# Patient Record
Sex: Female | Born: 1995 | Race: Black or African American | Hispanic: No | Marital: Single | Smoking: Never smoker
Health system: Southern US, Community
[De-identification: ages and names within clinical notes are randomized; demographics above are authoritative.]

## PROBLEM LIST (undated history)

## (undated) DIAGNOSIS — N912 Amenorrhea, unspecified: Secondary | ICD-10-CM

## (undated) DIAGNOSIS — R002 Palpitations: Secondary | ICD-10-CM

## (undated) DIAGNOSIS — E669 Obesity, unspecified: Secondary | ICD-10-CM

## (undated) DIAGNOSIS — E282 Polycystic ovarian syndrome: Secondary | ICD-10-CM

## (undated) DIAGNOSIS — M7989 Other specified soft tissue disorders: Secondary | ICD-10-CM

## (undated) DIAGNOSIS — E559 Vitamin D deficiency, unspecified: Secondary | ICD-10-CM

## (undated) DIAGNOSIS — K5900 Constipation, unspecified: Secondary | ICD-10-CM

## (undated) DIAGNOSIS — M2550 Pain in unspecified joint: Secondary | ICD-10-CM

## (undated) DIAGNOSIS — R0602 Shortness of breath: Secondary | ICD-10-CM

## (undated) DIAGNOSIS — M549 Dorsalgia, unspecified: Secondary | ICD-10-CM

## (undated) HISTORY — DX: Dorsalgia, unspecified: M54.9

## (undated) HISTORY — DX: Polycystic ovarian syndrome: E28.2

## (undated) HISTORY — DX: Vitamin D deficiency, unspecified: E55.9

## (undated) HISTORY — DX: Palpitations: R00.2

## (undated) HISTORY — DX: Other specified soft tissue disorders: M79.89

## (undated) HISTORY — DX: Amenorrhea, unspecified: N91.2

## (undated) HISTORY — DX: Pain in unspecified joint: M25.50

## (undated) HISTORY — DX: Shortness of breath: R06.02

## (undated) HISTORY — DX: Constipation, unspecified: K59.00

## (undated) HISTORY — DX: Obesity, unspecified: E66.9

---
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
## 2018-10-27 DIAGNOSIS — J069 Acute upper respiratory infection, unspecified: Secondary | ICD-10-CM | POA: Diagnosis not present

## 2019-05-20 DIAGNOSIS — N390 Urinary tract infection, site not specified: Secondary | ICD-10-CM | POA: Diagnosis not present

## 2019-10-21 ENCOUNTER — Other Ambulatory Visit (HOSPITAL_COMMUNITY)
Admission: RE | Admit: 2019-10-21 | Discharge: 2019-10-21 | Disposition: A | Discharge status: Home / Self Care | Payer: BC Managed Care – PPO | Source: Ambulatory Visit | Attending: Physician Assistant | Admitting: Physician Assistant

## 2019-10-21 ENCOUNTER — Other Ambulatory Visit: Payer: Self-pay | Admitting: Physician Assistant

## 2019-10-21 DIAGNOSIS — E669 Obesity, unspecified: Secondary | ICD-10-CM | POA: Diagnosis not present

## 2019-10-21 DIAGNOSIS — Z124 Encounter for screening for malignant neoplasm of cervix: Secondary | ICD-10-CM | POA: Insufficient documentation

## 2019-10-21 DIAGNOSIS — N912 Amenorrhea, unspecified: Secondary | ICD-10-CM | POA: Diagnosis not present

## 2019-10-21 DIAGNOSIS — Z23 Encounter for immunization: Secondary | ICD-10-CM | POA: Diagnosis not present

## 2019-10-21 DIAGNOSIS — R635 Abnormal weight gain: Secondary | ICD-10-CM | POA: Diagnosis not present

## 2019-10-21 DIAGNOSIS — Z0000 Encounter for general adult medical examination without abnormal findings: Secondary | ICD-10-CM | POA: Diagnosis not present

## 2019-10-26 LAB — CYTOLOGY - PAP
Comment: NEGATIVE
High risk HPV: POSITIVE — AB

## 2020-04-02 ENCOUNTER — Ambulatory Visit (INDEPENDENT_AMBULATORY_CARE_PROVIDER_SITE_OTHER): Payer: BC Managed Care – PPO | Admitting: Family Medicine

## 2020-04-02 ENCOUNTER — Encounter (INDEPENDENT_AMBULATORY_CARE_PROVIDER_SITE_OTHER): Payer: Self-pay | Admitting: Family Medicine

## 2020-04-02 ENCOUNTER — Other Ambulatory Visit: Payer: Self-pay

## 2020-04-02 VITALS — BP 108/75 | HR 72 | Temp 98.5°F | Ht 68.0 in | Wt 250.0 lb

## 2020-04-02 DIAGNOSIS — Z9189 Other specified personal risk factors, not elsewhere classified: Secondary | ICD-10-CM | POA: Diagnosis not present

## 2020-04-02 DIAGNOSIS — R0602 Shortness of breath: Secondary | ICD-10-CM

## 2020-04-02 DIAGNOSIS — F3289 Other specified depressive episodes: Secondary | ICD-10-CM | POA: Diagnosis not present

## 2020-04-02 DIAGNOSIS — R5383 Other fatigue: Secondary | ICD-10-CM

## 2020-04-02 DIAGNOSIS — Z6838 Body mass index (BMI) 38.0-38.9, adult: Secondary | ICD-10-CM

## 2020-04-02 DIAGNOSIS — N912 Amenorrhea, unspecified: Secondary | ICD-10-CM

## 2020-04-02 DIAGNOSIS — Z0289 Encounter for other administrative examinations: Secondary | ICD-10-CM

## 2020-04-03 LAB — LIPID PANEL WITH LDL/HDL RATIO
Cholesterol, Total: 154 mg/dL (ref 100–199)
HDL: 52 mg/dL (ref >39)
LDL Chol Calc (NIH): 89 mg/dL (ref 0–99)
LDL/HDL Ratio: 1.7 ratio (ref 0.0–3.2)
Triglycerides: 67 mg/dL (ref 0–149)
VLDL Cholesterol Cal: 13 mg/dL (ref 5–40)

## 2020-04-03 LAB — CBC WITH DIFFERENTIAL/PLATELET
Basophils Absolute: 0.1 10*3/uL (ref 0.0–0.2)
Basos: 1 %
EOS (ABSOLUTE): 0.1 10*3/uL (ref 0.0–0.4)
Eos: 2 %
Hematocrit: 41.4 % (ref 34.0–46.6)
Hemoglobin: 13.5 g/dL (ref 11.1–15.9)
Immature Grans (Abs): 0.1 10*3/uL (ref 0.0–0.1)
Immature Granulocytes: 1 %
Lymphocytes Absolute: 1.7 10*3/uL (ref 0.7–3.1)
Lymphs: 27 %
MCH: 24.8 pg — ABNORMAL LOW (ref 26.6–33.0)
MCHC: 32.6 g/dL (ref 31.5–35.7)
MCV: 76 fL — ABNORMAL LOW (ref 79–97)
Monocytes Absolute: 0.6 10*3/uL (ref 0.1–0.9)
Monocytes: 9 %
Neutrophils Absolute: 3.8 10*3/uL (ref 1.4–7.0)
Neutrophils: 60 %
Platelets: 321 10*3/uL (ref 150–450)
RBC: 5.44 x10E6/uL — ABNORMAL HIGH (ref 3.77–5.28)
RDW: 14.9 % (ref 11.7–15.4)
WBC: 6.4 10*3/uL (ref 3.4–10.8)

## 2020-04-03 LAB — TSH: TSH: 0.973 u[IU]/mL (ref 0.450–4.500)

## 2020-04-03 LAB — FOLATE: Folate: 14.6 ng/mL (ref >3.0)

## 2020-04-03 LAB — COMPREHENSIVE METABOLIC PANEL
ALT: 15 IU/L (ref 0–32)
AST: 18 IU/L (ref 0–40)
Albumin/Globulin Ratio: 1.3 (ref 1.2–2.2)
Albumin: 4.5 g/dL (ref 3.9–5.0)
Alkaline Phosphatase: 106 IU/L (ref 48–121)
BUN/Creatinine Ratio: 14 (ref 9–23)
BUN: 11 mg/dL (ref 6–20)
Bilirubin Total: 0.6 mg/dL (ref 0.0–1.2)
CO2: 25 mmol/L (ref 20–29)
Calcium: 9.7 mg/dL (ref 8.7–10.2)
Chloride: 102 mmol/L (ref 96–106)
Creatinine, Ser: 0.81 mg/dL (ref 0.57–1.00)
GFR calc Af Amer: 117 mL/min/{1.73_m2} (ref >59)
GFR calc non Af Amer: 101 mL/min/{1.73_m2} (ref >59)
Globulin, Total: 3.4 g/dL (ref 1.5–4.5)
Glucose: 82 mg/dL (ref 65–99)
Potassium: 4.5 mmol/L (ref 3.5–5.2)
Sodium: 142 mmol/L (ref 134–144)
Total Protein: 7.9 g/dL (ref 6.0–8.5)

## 2020-04-03 LAB — HEMOGLOBIN A1C
Est. average glucose Bld gHb Est-mCnc: 111 mg/dL
Hgb A1c MFr Bld: 5.5 % (ref 4.8–5.6)

## 2020-04-03 LAB — T3: T3, Total: 121 ng/dL (ref 71–180)

## 2020-04-03 LAB — T4: T4, Total: 6.8 ug/dL (ref 4.5–12.0)

## 2020-04-03 LAB — VITAMIN D 25 HYDROXY (VIT D DEFICIENCY, FRACTURES): Vit D, 25-Hydroxy: 28.3 ng/mL — ABNORMAL LOW (ref 30.0–100.0)

## 2020-04-03 LAB — INSULIN, RANDOM: INSULIN: 16 u[IU]/mL (ref 2.6–24.9)

## 2020-04-03 LAB — VITAMIN B12: Vitamin B-12: 729 pg/mL (ref 232–1245)

## 2020-04-04 NOTE — Progress Notes (Signed)
Dear Monica Oiler Redmon, PA-C,   Thank you for referring Monica York to our clinic. The following note includes my evaluation and treatment recommendations.  Chief Complaint:   OBESITY Monica York (MR# [id number]) is a 24 y.o. female who presents for evaluation and treatment of obesity and related comorbidities. Current BMI is Body mass index is 38.01 kg/m. Monica York has been struggling with her weight for many years and has been unsuccessful in either losing weight, maintaining weight loss, or reaching her healthy weight goal.  Monica York is on Provera. 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 She is often skipping breakfast and lunch. She eats for the first time around 4-5 pm, 2 pieces of chicken or takeout, Cookout, or [employer] 4 for 4 (eats and drinks all). Chicken wings, bread, and salad from [employer].  Monica York is currently in the action stage of change and ready to dedicate time achieving and maintaining a healthier weight. Monica York is interested in becoming our patient and working on intensive lifestyle modifications including (but not limited to) diet and exercise for weight loss.  Monica York's habits were reviewed today and are as follows: her desired weight loss is 70 lbs, she has been heavy most of her life, she started gaining weight at 3-97 years old, her heaviest weight ever was 258 pounds, she has significant food cravings issues, she skips meals frequently, she is frequently drinking liquids with calories, she frequently makes poor food choices, she frequently eats larger portions than normal and she struggles with emotional eating.  Depression Screen Monica York's Food and Mood (modified PHQ-9) score was 10.  Depression screen PHQ 2/9 04/02/2020  Decreased Interest 2  Down, Depressed, Hopeless 2  PHQ - 2 Score 4  Altered sleeping 1  Tired, decreased energy 1  Change in appetite 2  Feeling bad or failure about yourself  2  Trouble concentrating 0  Moving slowly or fidgety/restless 0  Suicidal thoughts 0  PHQ-9  Score 10  Difficult doing work/chores Not difficult at all   Subjective:   1. Other fatigue Monica York admits to daytime somnolence and admits to waking up still tired. Patent has a history of symptoms of daytime fatigue and morning headache. Monica York generally gets 6 or 7 hours of sleep per night, and states that she has generally restful sleep. Snoring is present. Apneic episodes are not present. Epworth Sleepiness Score is 14. EKG-T wave abnormality II, III, V3-V6, normal sinus rhythm at 76 BPM. She does report occasional palpitations.  2. SOB (shortness of breath) on exertion Monica York notes increasing shortness of breath with exercising and seems to be worsening over time with weight gain. She notes getting out of breath sooner with activity than she used to. This has not gotten worse recently. Monica York denies shortness of breath at rest or orthopnea.  3. Amenorrhea Monica York has a history of polycystic ovarian syndrome. Last A1c was 5.4.  4. Other depression, with emotional eating Monica York notes occasional emotionally eating, but she can control her symptoms and actions.  5. At risk for hyperglycemia Monica York is at increased risk for hypoglycemia due to changes in diet, diagnosis of diabetes, and/or insulin use.   Assessment/Plan:   1. Other fatigue Monica York does feel that her weight is causing her energy to be lower than it should be. Fatigue may be related to obesity, depression or many other causes. Labs will be ordered, and in the meanwhile, Monica York will focus on self care including making healthy food choices, increasing physical activity and focusing on stress reduction.  -  EKG 12-Lead - Comprehensive metabolic panel - CBC with Differential/Platelet - Lipid Panel With LDL/HDL Ratio - VITAMIN D 25 Hydroxy (Vit-D Deficiency, Fractures) - Vitamin B12 - Folate - T3 - T4 - TSH  2. SOB (shortness of breath) on exertion Monica York does feel that she gets out of breath more easily that she used to when she  exercises. Monica York's shortness of breath appears to be obesity related and exercise induced. She has agreed to work on weight loss and gradually increase exercise to treat her exercise induced shortness of breath. Will continue to monitor closely.  - Comprehensive metabolic panel - CBC with Differential/Platelet - Lipid Panel With LDL/HDL Ratio - VITAMIN D 25 Hydroxy (Vit-D Deficiency, Fractures) - Vitamin B12 - Folate - T3 - T4 - TSH - ECHOCARDIOGRAM COMPLETE; Future  3. Amenorrhea We will check labs today, and Monica York will follow up as directed.  - Hemoglobin A1c - Insulin, random  4. Other depression, with emotional eating Behavior modification techniques were discussed today to help Monica York deal with her emotional/non-hunger eating behaviors. We will follow up at her next appointment. Orders and follow up as documented in patient record.   5. At risk for hyperglycemia Monica York was given approximately 15 minutes of counseling today regarding prevention of hyperglycemia. She was advised of hyperglycemia causes and the fact hyperglycemia is often asymptomatic. Monica York was instructed to avoid skipping meals, eat regular protein rich meals and schedule low calorie but protein rich snacks as needed.   Repetitive spaced learning was employed today to elicit superior memory formation and behavioral change  6. Class 2 severe obesity with serious comorbidity and body mass index (BMI) of 38.0 to 38.9 in adult, unspecified obesity type (HCC) Monica York is currently in the action stage of change and her goal is to continue with weight loss efforts. I recommend Monica York begin the structured treatment plan as follows:  She has agreed to the [employer] + 300 calories.  Exercise goals: No exercise has been prescribed at this time.   Behavioral modification strategies: increasing lean protein intake, meal planning and cooking strategies, keeping healthy foods in the home and planning for success.  She was  informed of the importance of frequent follow-up visits to maximize her success with intensive lifestyle modifications for her multiple health conditions. She was informed we would discuss her lab results at her next visit unless there is a critical issue that needs to be addressed sooner. Monica York agreed to keep her next visit at the agreed upon time to discuss these results.  Objective:   Blood pressure 108/75, pulse 72, temperature 98.5 F (36.9 C), temperature source Oral, height 5\' 8"  (1.727 m), weight 250 lb (113.4 kg), last menstrual period 03/27/2020, SpO2 99 %. Body mass index is 38.01 kg/m.  EKG: Normal sinus rhythm, rate 76 BPM.  Indirect Calorimeter completed today shows a VO2 of 292 and a REE of 2033.  Her calculated basal metabolic rate is 2034 thus her basal metabolic rate is worse than expected.  General: Cooperative, alert, well developed, in no acute distress. HEENT: Conjunctivae and lids unremarkable. Cardiovascular: Regular rhythm.  Lungs: Normal work of breathing. Neurologic: No focal deficits.   Lab Results  Component Value Date   CREATININE 0.81 04/02/2020   BUN 11 04/02/2020   NA 142 04/02/2020   K 4.5 04/02/2020   CL 102 04/02/2020   CO2 25 04/02/2020   Lab Results  Component Value Date   ALT 15 04/02/2020   AST 18 04/02/2020  ALKPHOS 106 04/02/2020   BILITOT 0.6 04/02/2020   Lab Results  Component Value Date   HGBA1C 5.5 04/02/2020   Lab Results  Component Value Date   INSULIN 16.0 04/02/2020   Lab Results  Component Value Date   TSH 0.973 04/02/2020   Lab Results  Component Value Date   CHOL 154 04/02/2020   HDL 52 04/02/2020   LDLCALC 89 04/02/2020   TRIG 67 04/02/2020   Lab Results  Component Value Date   WBC 6.4 04/02/2020   HGB 13.5 04/02/2020   HCT 41.4 04/02/2020   MCV 76 (L) 04/02/2020   PLT 321 04/02/2020   No results found for: IRON, TIBC, FERRITIN  Attestation Statements:   This is the patient's first visit at [facility]. The patient's NEW PATIENT PACKET was reviewed at length. Included in the packet: current and past health history, medications, allergies, ROS, gynecologic history (women only), surgical history, family history, social history, weight history, weight loss surgery history (for those that have had weight loss surgery), nutritional evaluation, mood and food questionnaire, PHQ9, Epworth questionnaire, sleep habits questionnaire, patient life and health improvement goals questionnaire. These will all be scanned into the patient's chart under media.   During the visit, I independently reviewed the patient's EKG, bioimpedance scale results, and indirect calorimeter results. I used this information to tailor a meal plan for the patient that will help her to lose weight and will improve her obesity-related conditions going forward. I performed a medically necessary appropriate examination and/or evaluation. I discussed the assessment and treatment plan with the patient. The patient was provided an opportunity to ask questions and all were answered. The patient agreed with the plan and demonstrated an understanding of the instructions. Labs were ordered at this visit and will be reviewed at the next visit unless more critical results need to be addressed immediately. Clinical information was updated and documented in the EMR.   Time spent on visit including pre-visit chart review and post-visit care was 45 minutes.   A separate 15 minutes was spent on risk counseling (see above).    I, Burt Knack, am acting as transcriptionist for Reuben Likes, MD. I have reviewed the above documentation for accuracy and completeness, and I agree with the above. - Katherina Mires, MD

## 2020-04-09 ENCOUNTER — Ambulatory Visit (INDEPENDENT_AMBULATORY_CARE_PROVIDER_SITE_OTHER): Payer: Managed Care, Other (non HMO) | Admitting: Family Medicine

## 2020-04-16 ENCOUNTER — Encounter (INDEPENDENT_AMBULATORY_CARE_PROVIDER_SITE_OTHER): Payer: Self-pay | Admitting: Family Medicine

## 2020-04-16 ENCOUNTER — Ambulatory Visit (INDEPENDENT_AMBULATORY_CARE_PROVIDER_SITE_OTHER): Payer: BC Managed Care – PPO | Admitting: Family Medicine

## 2020-04-16 ENCOUNTER — Other Ambulatory Visit: Payer: Self-pay

## 2020-04-16 VITALS — BP 107/70 | HR 62 | Temp 98.1°F | Ht 68.0 in | Wt 244.0 lb

## 2020-04-16 DIAGNOSIS — Z9189 Other specified personal risk factors, not elsewhere classified: Secondary | ICD-10-CM

## 2020-04-16 DIAGNOSIS — D509 Iron deficiency anemia, unspecified: Secondary | ICD-10-CM | POA: Diagnosis not present

## 2020-04-16 DIAGNOSIS — E559 Vitamin D deficiency, unspecified: Secondary | ICD-10-CM | POA: Diagnosis not present

## 2020-04-16 DIAGNOSIS — E8881 Metabolic syndrome: Secondary | ICD-10-CM

## 2020-04-16 DIAGNOSIS — Z6838 Body mass index (BMI) 38.0-38.9, adult: Secondary | ICD-10-CM

## 2020-04-16 MED ORDER — VITAMIN D (ERGOCALCIFEROL) 1.25 MG (50000 UNIT) PO CAPS: 50000.0000 [IU] | ORAL_CAPSULE | ORAL | 0 refills | Status: DC

## 2020-04-17 NOTE — Progress Notes (Signed)
Chief Complaint:   OBESITY Monica York is here to discuss her progress with her obesity treatment plan along with follow-up of her obesity related diagnoses. Monica York is on the Pescatarian Plan + 300 calories and states she is following her eating plan approximately 98% of the time. Monica York states she is doing 0 minutes 0 times per week.  Today's visit was #: 2 Starting weight: 250 lbs Starting date: 04/02/2020 Today's weight: 244 lbs Today's date: 04/16/2020 Total lbs lost to date: 6 Total lbs lost since last in-office visit: 6  Interim History: Monica York didn't get hungry while on the plan. She substituted string cheese for milk. She was doing salmon or seafood for dinner, and was wondering about chicken or [place]. She didn't really snack much and she doesn't think she used her snack calories.  Subjective:   1. Vitamin D deficiency Monica York has a new diagnosis of Vit D deficiency. She is not on Vit D supplementation, and she notes fatigue. Last Vit D level was 28.3. I discussed labs with the patient today.  2. Insulin resistance Monica York has a new diagnosis of insulin resistance. Last A1c was 5.5 and insulin 16.0. She is not on medications, and unsure of a history of polycystic ovarian syndrome. I discussed labs with the patient today.  3. Microcytic anemia Monica York has a new diagnosis of microcytic anemia. Last MCV was 70, and hemoglobin and hematocrit were within normal limits. She denies a history of iron deficiency or blood dyscrias. I discussed labs with the patient today.  4. At risk of diabetes mellitus Monica York is at higher than average risk for developing diabetes due to her obesity.   Assessment/Plan:   1. Vitamin D deficiency Low Vitamin D level contributes to fatigue and are associated with obesity, breast, and colon cancer. Monica York agreed to start prescription Vitamin D 50,000 IU every week with no refills. She will follow-up for routine testing of Vitamin D, at least 2-3 times per year to  avoid over-replacement.  - Vitamin D, Ergocalciferol, (DRISDOL) 1.25 MG (50000 UNIT) CAPS capsule; Take 1 capsule (50,000 Units total) by mouth every 7 (seven) days.  Dispense: 4 capsule; Refill: 0  2. Insulin resistance Monica York will continue to work on weight loss, exercise, and decreasing simple carbohydrates to help decrease the risk of diabetes. We will repeat labs in 3 months. Monica York agreed to follow-up with [place] as directed to closely monitor her progress.  3. Microcytic anemia We will repeat labs in 1 month. Monica York will continue to follow up as directed.  4. At risk of diabetes mellitus Monica York was given approximately 25 minutes of diabetes education and counseling today. We discussed intensive lifestyle modifications today with an emphasis on weight loss as well as increasing exercise and decreasing simple carbohydrates in her diet. We also reviewed medication options with an emphasis on risk versus benefit of those discussed.   Repetitive spaced learning was employed today to elicit superior memory formation and behavioral change.  5. Class 2 severe obesity with serious comorbidity and body mass index (BMI) of 38.0 to 38.9 in adult, unspecified obesity type (HCC) Monica York is currently in the action stage of change. As such, her goal is to continue with weight loss efforts. She has agreed to the [employer] + 300 calories.   Exercise goals: All adults should avoid inactivity. Some physical activity is better than none, and adults who participate in any amount of physical activity gain some health benefits.  Behavioral modification strategies: increasing lean protein  intake, increasing vegetables, meal planning and cooking strategies and keeping healthy foods in the home.  Monica York has agreed to follow-up with our clinic in 2 weeks. She was informed of the importance of frequent follow-up visits to maximize her success with intensive lifestyle modifications for her multiple health conditions.    Objective:   Blood pressure 107/70, pulse 62, temperature 98.1 F (36.7 C), temperature source Oral, height 5\' 8"  (1.727 m), weight 244 lb (110.7 kg), last menstrual period 03/27/2020, SpO2 97 %. Body mass index is 37.1 kg/m.  General: Cooperative, alert, well developed, in no acute distress. HEENT: Conjunctivae and lids unremarkable. Cardiovascular: Regular rhythm.  Lungs: Normal work of breathing. Neurologic: No focal deficits.   Lab Results  Component Value Date   CREATININE 0.81 04/02/2020   BUN 11 04/02/2020   NA 142 04/02/2020   K 4.5 04/02/2020   CL 102 04/02/2020   CO2 25 04/02/2020   Lab Results  Component Value Date   ALT 15 04/02/2020   AST 18 04/02/2020   ALKPHOS 106 04/02/2020   BILITOT 0.6 04/02/2020   Lab Results  Component Value Date   HGBA1C 5.5 04/02/2020   Lab Results  Component Value Date   INSULIN 16.0 04/02/2020   Lab Results  Component Value Date   TSH 0.973 04/02/2020   Lab Results  Component Value Date   CHOL 154 04/02/2020   HDL 52 04/02/2020   LDLCALC 89 04/02/2020   TRIG 67 04/02/2020   Lab Results  Component Value Date   WBC 6.4 04/02/2020   HGB 13.5 04/02/2020   HCT 41.4 04/02/2020   MCV 76 (L) 04/02/2020   PLT 321 04/02/2020   No results found for: IRON, TIBC, FERRITIN  Attestation Statements:   Reviewed by clinician on day of visit: allergies, medications, problem list, medical history, surgical history, family history, social history, and previous encounter notes.    I, 04/04/2020, am acting as transcriptionist for Burt Knack, MD.  I have reviewed the above documentation for accuracy and completeness, and I agree with the above. - Reuben Likes, MD

## 2020-05-01 ENCOUNTER — Ambulatory Visit (INDEPENDENT_AMBULATORY_CARE_PROVIDER_SITE_OTHER): Payer: BC Managed Care – PPO | Admitting: Family Medicine

## 2020-05-01 ENCOUNTER — Other Ambulatory Visit: Payer: Self-pay

## 2020-05-01 ENCOUNTER — Encounter (INDEPENDENT_AMBULATORY_CARE_PROVIDER_SITE_OTHER): Payer: Self-pay | Admitting: Family Medicine

## 2020-05-01 VITALS — BP 116/76 | HR 64 | Temp 98.1°F | Ht 68.0 in | Wt 242.0 lb

## 2020-05-01 DIAGNOSIS — Z9189 Other specified personal risk factors, not elsewhere classified: Secondary | ICD-10-CM

## 2020-05-01 DIAGNOSIS — Z6836 Body mass index (BMI) 36.0-36.9, adult: Secondary | ICD-10-CM

## 2020-05-01 DIAGNOSIS — E559 Vitamin D deficiency, unspecified: Secondary | ICD-10-CM | POA: Diagnosis not present

## 2020-05-01 DIAGNOSIS — D509 Iron deficiency anemia, unspecified: Secondary | ICD-10-CM | POA: Diagnosis not present

## 2020-05-01 DIAGNOSIS — E66812 Obesity, class 2: Secondary | ICD-10-CM

## 2020-05-01 MED ORDER — VITAMIN D (ERGOCALCIFEROL) 1.25 MG (50000 UNIT) PO CAPS: 50000.0000 [IU] | ORAL_CAPSULE | ORAL | 0 refills | Status: DC

## 2020-05-01 NOTE — Progress Notes (Signed)
Chief Complaint:   OBESITY Monica York is here to discuss her progress with her obesity treatment plan along with follow-up of her obesity related diagnoses. Monica York is on the Pescatarian Plan + 300 calories and states she is following her eating plan approximately 85% of the time. Monica York states she is doing cardio for 25-30 minutes 3 times per week.  Today's visit was #: 3 Starting weight: 250 lbs Starting date: 04/02/2020 Today's weight: 242 lbs Today's date: 05/01/2020 Total lbs lost to date: 8 Total lbs lost since last in-office visit: 2  Interim History: Monica York voices the last few weeks she ate off the plan secondary to cookout and then eating out. She has tried to add in a bit of exercise. She is able to eat all of the food on the plan and did introduce chicken. She denies hunger. Not eating lunch at work so she is eating lunch at home and then eating later. She denies obstacles in the upcoming few weeks.   Subjective:   1. Vitamin D deficiency Monica York denies nausea, vomiting, or muscle weakness, but she notes fatigue. Last Vit D level was 28.3. She is on prescription Vit D.  2. Microcytic anemia Monica York is not on iron, and she is on provera for irregular menses.  3. At risk for osteoporosis Monica York is at higher risk of osteopenia and osteoporosis due to Vitamin D deficiency.   Assessment/Plan:   1. Vitamin D deficiency Low Vitamin D level contributes to fatigue and are associated with obesity, breast, and colon cancer. We will refill prescription Vitamin D for 1 month. Monica York will follow-up for routine testing of Vitamin D, at least 2-3 times per year to avoid over-replacement.  - Vitamin D, Ergocalciferol, (DRISDOL) 1.25 MG (50000 UNIT) CAPS capsule; Take 1 capsule (50,000 Units total) by mouth every 7 (seven) days.  Dispense: 4 capsule; Refill: 0  2. Microcytic anemia We will follow up on CBC and anemia panel at Monica York's next blood draw. Orders and follow up as documented in patient  record.  Counseling . Iron is essential for our bodies to make red blood cells.  Reasons that someone may be deficient include: an iron-deficient diet (more likely in those following vegan or vegetarian diets), women with heavy menses, patients with GI disorders or poor absorption, patients that have had bariatric surgery, frequent blood donors, patients with cancer, and patients with heart disease.   Monica York foods include dark leafy greens, red and white meats, eggs, seafood, and beans.   . Certain foods and drinks prevent your body from absorbing iron properly. Avoid eating these foods in the same meal as iron-rich foods or with iron supplements. These foods include: coffee, black tea, and red wine; milk, dairy products, and foods that are high in calcium; beans and soybeans; whole grains.  . Constipation can be a side effect of iron supplementation. Increased water and fiber intake are helpful. Water goal: > 2 liters/day. Fiber goal: > 25 grams/day.  3. At risk for osteoporosis Monica York was given approximately 15 minutes of osteoporosis prevention counseling today. Monica York is at risk for osteopenia and osteoporosis due to her Vitamin D deficiency. She was encouraged to take her Vitamin D and follow her higher calcium diet and increase strengthening exercise to help strengthen her bones and decrease her risk of osteopenia and osteoporosis.  Repetitive spaced learning was employed today to elicit superior memory formation and behavioral change.  4. Class 2 severe obesity with serious comorbidity and body mass index (  BMI) of 36.0 to 36.9 in adult, unspecified obesity type (HCC) Monica York is currently in the action stage of change. As such, her goal is to continue with weight loss efforts. She has agreed to the [employer] + 300 calories.   Exercise goals: All adults should avoid inactivity. Some physical activity is better than none, and adults who participate in any amount of physical activity gain  some health benefits.  Behavioral modification strategies: increasing lean protein intake, increasing vegetables, meal planning and cooking strategies, keeping healthy foods in the home and planning for success.  Monica York has agreed to follow-up with our clinic in 2 weeks. She was informed of the importance of frequent follow-up visits to maximize her success with intensive lifestyle modifications for her multiple health conditions.   Objective:   Blood pressure 116/76, pulse 64, temperature 98.1 F (36.7 C), temperature source Oral, height 5\' 8"  (1.727 m), weight 242 lb (109.8 kg), last menstrual period 04/02/2020, SpO2 97 %. Body mass index is 36.8 kg/m.  General: Cooperative, alert, well developed, in no acute distress. HEENT: Conjunctivae and lids unremarkable. Cardiovascular: Regular rhythm.  Lungs: Normal work of breathing. Neurologic: No focal deficits.   Lab Results  Component Value Date   CREATININE 0.81 04/02/2020   BUN 11 04/02/2020   NA 142 04/02/2020   K 4.5 04/02/2020   CL 102 04/02/2020   CO2 25 04/02/2020   Lab Results  Component Value Date   ALT 15 04/02/2020   AST 18 04/02/2020   ALKPHOS 106 04/02/2020   BILITOT 0.6 04/02/2020   Lab Results  Component Value Date   HGBA1C 5.5 04/02/2020   Lab Results  Component Value Date   INSULIN 16.0 04/02/2020   Lab Results  Component Value Date   TSH 0.973 04/02/2020   Lab Results  Component Value Date   CHOL 154 04/02/2020   HDL 52 04/02/2020   LDLCALC 89 04/02/2020   TRIG 67 04/02/2020   Lab Results  Component Value Date   WBC 6.4 04/02/2020   HGB 13.5 04/02/2020   HCT 41.4 04/02/2020   MCV 76 (L) 04/02/2020   PLT 321 04/02/2020   No results found for: IRON, TIBC, FERRITIN  Attestation Statements:   Reviewed by clinician on day of visit: allergies, medications, problem list, medical history, surgical history, family history, social history, and previous encounter notes.   I, 04/04/2020, am  acting as transcriptionist for Burt Knack, MD.  I have reviewed the above documentation for accuracy and completeness, and I agree with the above. - Reuben Likes, MD

## 2020-05-04 ENCOUNTER — Other Ambulatory Visit (HOSPITAL_COMMUNITY): Payer: BC Managed Care – PPO

## 2020-05-15 ENCOUNTER — Ambulatory Visit (INDEPENDENT_AMBULATORY_CARE_PROVIDER_SITE_OTHER): Payer: BC Managed Care – PPO | Admitting: Family Medicine

## 2020-05-15 ENCOUNTER — Encounter (INDEPENDENT_AMBULATORY_CARE_PROVIDER_SITE_OTHER): Payer: Self-pay | Admitting: Family Medicine

## 2020-05-15 ENCOUNTER — Other Ambulatory Visit: Payer: Self-pay

## 2020-05-15 VITALS — BP 127/82 | HR 55 | Temp 98.1°F | Ht 68.0 in | Wt 239.0 lb

## 2020-05-15 DIAGNOSIS — Z6836 Body mass index (BMI) 36.0-36.9, adult: Secondary | ICD-10-CM | POA: Diagnosis not present

## 2020-05-15 DIAGNOSIS — E559 Vitamin D deficiency, unspecified: Secondary | ICD-10-CM | POA: Diagnosis not present

## 2020-05-15 DIAGNOSIS — E8881 Metabolic syndrome: Secondary | ICD-10-CM

## 2020-05-15 DIAGNOSIS — E66812 Obesity, class 2: Secondary | ICD-10-CM

## 2020-05-15 DIAGNOSIS — E88819 Insulin resistance, unspecified: Secondary | ICD-10-CM

## 2020-05-16 NOTE — Progress Notes (Signed)
Chief Complaint:   OBESITY Monica York is here to discuss her progress with her obesity treatment plan along with follow-up of her obesity related diagnoses. Monica York is on the Pescatarian Plan + 300 calories and states she is following her eating plan approximately 80% of the time. Monica York states she is doing 0 minutes 0 times per week.  Today's visit was #: 4 Starting weight: 250 lbs Starting date: 04/02/2020 Today's weight: 239 lbs Today's date: 05/15/2020 Total lbs lost to date: 11 Total lbs lost since last in-office visit: 3  Interim History: Monica York voices the last few weeks were good. She tried doing a different option for lunch. Eating all food on the plan. She denies hunger. For snacks she is doing string cheese, and bag of chex mix. Her plan is a baby shower in the next 2 weeks. Only obstacle is the weekend.  Subjective:   1. Vitamin D deficiency Monica York denies nausea, vomiting, or muscle weakness, but she notes fatigue. She is on prescription Vit D.  2. Insulin resistance Monica York's last A1c was 5.5 and insulin 16.0. She is not on medications, and she notes minimal carbohydrate cravings.  Assessment/Plan:   1. Vitamin D deficiency Low Vitamin D level contributes to fatigue and are associated with obesity, breast, and colon cancer. Monica York agreed to continue taking prescription Vitamin D 50,000 IU every week, no refill needed. She will follow-up for routine testing of Vitamin D, at least 2-3 times per year to avoid over-replacement.  2. Insulin resistance Monica York will continue to work on weight loss, exercise, and decreasing simple carbohydrates to help decrease the risk of diabetes. We will repeat labs in early 2022. Monica York agreed to follow-up with [place] as directed to closely monitor her progress.  3. Class 2 severe obesity with serious comorbidity and body mass index (BMI) of 36.0 to 36.9 in adult, unspecified obesity type (HCC) Monica York is currently in the action stage of change. As such, her  goal is to continue with weight loss efforts. She has agreed to the [employer].   Exercise goals: No exercise has been prescribed at this time.  Behavioral modification strategies: increasing lean protein intake, increasing vegetables, meal planning and cooking strategies, keeping healthy foods in the home and planning for success.  Monica York has agreed to follow-up with our clinic in 2 weeks. She was informed of the importance of frequent follow-up visits to maximize her success with intensive lifestyle modifications for her multiple health conditions.   Objective:   Blood pressure 127/82, pulse (!) 55, temperature 98.1 F (36.7 C), temperature source Oral, height 5\' 8"  (1.727 m), weight 239 lb (108.4 kg), last menstrual period 03/28/2020, SpO2 97 %. Body mass index is 36.34 kg/m.  General: Cooperative, alert, well developed, in no acute distress. HEENT: Conjunctivae and lids unremarkable. Cardiovascular: Regular rhythm.  Lungs: Normal work of breathing. Neurologic: No focal deficits.   Lab Results  Component Value Date   CREATININE 0.81 04/02/2020   BUN 11 04/02/2020   NA 142 04/02/2020   K 4.5 04/02/2020   CL 102 04/02/2020   CO2 25 04/02/2020   Lab Results  Component Value Date   ALT 15 04/02/2020   AST 18 04/02/2020   ALKPHOS 106 04/02/2020   BILITOT 0.6 04/02/2020   Lab Results  Component Value Date   HGBA1C 5.5 04/02/2020   Lab Results  Component Value Date   INSULIN 16.0 04/02/2020   Lab Results  Component Value Date   TSH 0.973 04/02/2020  Lab Results  Component Value Date   CHOL 154 04/02/2020   HDL 52 04/02/2020   LDLCALC 89 04/02/2020   TRIG 67 04/02/2020   Lab Results  Component Value Date   WBC 6.4 04/02/2020   HGB 13.5 04/02/2020   HCT 41.4 04/02/2020   MCV 76 (L) 04/02/2020   PLT 321 04/02/2020   No results found for: IRON, TIBC, FERRITIN  Attestation Statements:   Reviewed by clinician on day of visit: allergies, medications,  problem list, medical history, surgical history, family history, social history, and previous encounter notes.  Time spent on visit including pre-visit chart review and post-visit care and charting was 15 minutes.    I, Burt Knack, am acting as transcriptionist for Reuben Likes, MD.  I have reviewed the above documentation for accuracy and completeness, and I agree with the above. - Katherina Mires, MD

## 2020-05-23 ENCOUNTER — Ambulatory Visit (HOSPITAL_COMMUNITY): Payer: BC Managed Care – PPO

## 2020-05-31 ENCOUNTER — Ambulatory Visit (INDEPENDENT_AMBULATORY_CARE_PROVIDER_SITE_OTHER): Payer: BC Managed Care – PPO | Admitting: Family Medicine

## 2020-05-31 ENCOUNTER — Other Ambulatory Visit: Payer: Self-pay

## 2020-05-31 ENCOUNTER — Encounter (INDEPENDENT_AMBULATORY_CARE_PROVIDER_SITE_OTHER): Payer: Self-pay | Admitting: Family Medicine

## 2020-05-31 VITALS — BP 114/76 | HR 65 | Temp 98.2°F | Ht 68.0 in | Wt 236.0 lb

## 2020-05-31 DIAGNOSIS — R718 Other abnormality of red blood cells: Secondary | ICD-10-CM | POA: Diagnosis not present

## 2020-05-31 DIAGNOSIS — E559 Vitamin D deficiency, unspecified: Secondary | ICD-10-CM

## 2020-05-31 DIAGNOSIS — Z9189 Other specified personal risk factors, not elsewhere classified: Secondary | ICD-10-CM | POA: Diagnosis not present

## 2020-05-31 DIAGNOSIS — E66812 Obesity, class 2: Secondary | ICD-10-CM

## 2020-05-31 DIAGNOSIS — Z6835 Body mass index (BMI) 35.0-35.9, adult: Secondary | ICD-10-CM | POA: Diagnosis not present

## 2020-06-01 LAB — CBC WITH DIFFERENTIAL/PLATELET
Basophils Absolute: 0 10*3/uL (ref 0.0–0.2)
Basos: 1 %
EOS (ABSOLUTE): 0.1 10*3/uL (ref 0.0–0.4)
Eos: 2 %
Hemoglobin: 14.6 g/dL (ref 11.1–15.9)
Immature Grans (Abs): 0 10*3/uL (ref 0.0–0.1)
Immature Granulocytes: 1 %
Lymphocytes Absolute: 1.5 10*3/uL (ref 0.7–3.1)
Lymphs: 33 %
MCH: 26.2 pg — ABNORMAL LOW (ref 26.6–33.0)
MCHC: 34.3 g/dL (ref 31.5–35.7)
MCV: 76 fL — ABNORMAL LOW (ref 79–97)
Monocytes Absolute: 0.5 10*3/uL (ref 0.1–0.9)
Monocytes: 11 %
Neutrophils Absolute: 2.4 10*3/uL (ref 1.4–7.0)
Neutrophils: 52 %
Platelets: 318 10*3/uL (ref 150–450)
RBC: 5.58 x10E6/uL — ABNORMAL HIGH (ref 3.77–5.28)
RDW: 14.4 % (ref 11.7–15.4)
WBC: 4.4 10*3/uL (ref 3.4–10.8)

## 2020-06-01 LAB — ANEMIA PANEL
Ferritin: 34 ng/mL (ref 15–150)
Folate, Hemolysate: 388 ng/mL
Folate, RBC: 911 ng/mL (ref >498)
Hematocrit: 42.6 % (ref 34.0–46.6)
Iron Saturation: 13 % — ABNORMAL LOW (ref 15–55)
Iron: 56 ug/dL (ref 27–159)
Retic Ct Pct: 0.8 % (ref 0.6–2.6)
Total Iron Binding Capacity: 415 ug/dL (ref 250–450)
UIBC: 359 ug/dL (ref 131–425)
Vitamin B-12: 852 pg/mL (ref 232–1245)

## 2020-06-05 NOTE — Progress Notes (Signed)
Chief Complaint:   OBESITY Monica York is here to discuss her progress with her obesity treatment plan along with follow-up of her obesity related diagnoses. [name] is on the Pescatarian Plan +300 calories and states she is following her eating plan approximately 75% of the time. [name] states she is working out for 60 minutes 2 times per week.  Today's visit was #: 5 Starting weight: 250 lbs Starting date: 04/02/2020 Today's weight: 236 lbs Today's date: 05/31/2020 Total lbs lost to date: 14 lbs Total lbs lost since last in-office visit: 3 lbs  Interim History: [name] says she is finding that the more time passes, the easier following the plan is.  The first week after her appointment, she was out of town and struggled to follow the plan as closely.  Occasional sweets cravings.  She is going to a steakhouse for her boyfriend's birthday next week.  Subjective:   1. Microcytosis Last MCV 70.  Elevated hemoglobin.  She endorses fatigue.  2. Vitamin D deficiency [name]'s Vitamin D level was 28.3 on 04/02/2020. She is currently taking prescription vitamin D 50,000 IU each week. She denies nausea, vomiting or muscle weakness.  She endorses fatigue.  3. At risk for osteoporosis [name] is at higher risk of osteopenia and osteoporosis due to Vitamin D deficiency.   Assessment/Plan:   1. Microcytosis CBC w/diff, anemia panel today.  - CBC w/Diff/Platelet - Anemia panel  2. Vitamin D deficiency Low Vitamin D level contributes to fatigue and are associated with obesity, breast, and colon cancer. She agrees to continue to take prescription Vitamin D @50 ,000 IU every week and will follow-up for routine testing of Vitamin D, at least 2-3 times per year to avoid over-replacement.  No refill needed today.  3. At risk for osteoporosis [name] was given approximately 15 minutes of osteoporosis prevention counseling today. [name] is at risk for osteopenia and osteoporosis due to her Vitamin D deficiency.  She was encouraged to take her Vitamin D and follow her higher calcium diet and increase strengthening exercise to help strengthen her bones and decrease her risk of osteopenia and osteoporosis.  Repetitive spaced learning was employed today to elicit superior memory formation and behavioral change.  4. Class 2 severe obesity with serious comorbidity and body mass index (BMI) of 35.0 to 35.9 in adult, unspecified obesity type (HCC)  [name] is currently in the action stage of change. As such, her goal is to continue with weight loss efforts. She has agreed to the +300 calories.   Exercise goals: For substantial health benefits, adults should do at least 150 minutes (2 hours and 30 minutes) a week of moderate-intensity, or 75 minutes (1 hour and 15 minutes) a week of vigorous-intensity aerobic physical activity, or an equivalent combination of moderate- and vigorous-intensity aerobic activity. Aerobic activity should be performed in episodes of at least 10 minutes, and preferably, it should be spread throughout the week.  Behavioral modification strategies: increasing lean protein intake, meal planning and cooking strategies, keeping healthy foods in the home and planning for success.  [name] has agreed to follow-up with our clinic in 3 weeks. She was informed of the importance of frequent follow-up visits to maximize her success with intensive lifestyle modifications for her multiple health conditions.   [name] was informed we would discuss her lab results at her next visit unless there is a critical issue that needs to be addressed sooner. [name] agreed to keep her next visit at the agreed upon time  to discuss these results.  Objective:   Blood pressure 114/76, pulse 65, temperature 98.2 F (36.8 C), temperature source Oral, height 5\' 8"  (1.727 m), weight 236 lb (107 kg), SpO2 100 %. Body mass index is 35.88 kg/m.  General: Cooperative, alert, well developed, in no acute  distress. HEENT: Conjunctivae and lids unremarkable. Cardiovascular: Regular rhythm.  Lungs: Normal work of breathing. Neurologic: No focal deficits.   Lab Results  Component Value Date   CREATININE 0.81 04/02/2020   BUN 11 04/02/2020   NA 142 04/02/2020   K 4.5 04/02/2020   CL 102 04/02/2020   CO2 25 04/02/2020   Lab Results  Component Value Date   ALT 15 04/02/2020   AST 18 04/02/2020   ALKPHOS 106 04/02/2020   BILITOT 0.6 04/02/2020   Lab Results  Component Value Date   HGBA1C 5.5 04/02/2020   Lab Results  Component Value Date   INSULIN 16.0 04/02/2020   Lab Results  Component Value Date   TSH 0.973 04/02/2020   Lab Results  Component Value Date   CHOL 154 04/02/2020   HDL 52 04/02/2020   LDLCALC 89 04/02/2020   TRIG 67 04/02/2020   Lab Results  Component Value Date   WBC 4.4 05/31/2020   HGB 14.6 05/31/2020   HCT 42.6 05/31/2020   MCV 76 (L) 05/31/2020   PLT 318 05/31/2020   Lab Results  Component Value Date   IRON 56 05/31/2020   TIBC 415 05/31/2020   FERRITIN 34 05/31/2020   Attestation Statements:   Reviewed by clinician on day of visit: allergies, medications, problem list, medical history, surgical history, family history, social history, and previous encounter notes.  I, 06/02/2020, CMA, am acting as transcriptionist for Insurance claims handler, MD.  I have reviewed the above documentation for accuracy and completeness, and I agree with the above. - Reuben Likes, MD

## 2020-06-13 ENCOUNTER — Encounter (HOSPITAL_COMMUNITY): Payer: Self-pay | Admitting: Cardiology

## 2020-06-13 ENCOUNTER — Other Ambulatory Visit (HOSPITAL_COMMUNITY): Payer: BC Managed Care – PPO

## 2020-06-13 NOTE — Progress Notes (Unsigned)
Patient ID: Monica York, female   DOB: May 14, 1996, 24 y.o.   MRN: [id number]   Verified appointment "no show" status with Renetta at 4:11pm.

## 2020-06-18 ENCOUNTER — Telehealth (HOSPITAL_COMMUNITY): Payer: Self-pay | Admitting: Family Medicine

## 2020-06-18 ENCOUNTER — Encounter (HOSPITAL_COMMUNITY): Payer: Self-pay | Admitting: Family Medicine

## 2020-06-18 NOTE — Telephone Encounter (Signed)
error 

## 2020-06-21 ENCOUNTER — Other Ambulatory Visit: Payer: Self-pay

## 2020-06-21 ENCOUNTER — Ambulatory Visit (INDEPENDENT_AMBULATORY_CARE_PROVIDER_SITE_OTHER): Payer: BC Managed Care – PPO | Admitting: Family Medicine

## 2020-06-21 ENCOUNTER — Encounter (INDEPENDENT_AMBULATORY_CARE_PROVIDER_SITE_OTHER): Payer: Self-pay | Admitting: Family Medicine

## 2020-06-21 VITALS — BP 112/74 | HR 80 | Temp 98.0°F | Ht 68.0 in | Wt 237.0 lb

## 2020-06-21 DIAGNOSIS — Z6836 Body mass index (BMI) 36.0-36.9, adult: Secondary | ICD-10-CM | POA: Diagnosis not present

## 2020-06-21 DIAGNOSIS — D508 Other iron deficiency anemias: Secondary | ICD-10-CM

## 2020-06-21 DIAGNOSIS — E559 Vitamin D deficiency, unspecified: Secondary | ICD-10-CM

## 2020-06-21 DIAGNOSIS — Z9189 Other specified personal risk factors, not elsewhere classified: Secondary | ICD-10-CM | POA: Diagnosis not present

## 2020-06-21 MED ORDER — VITAMIN D (ERGOCALCIFEROL) 1.25 MG (50000 UNIT) PO CAPS: 50000.0000 [IU] | ORAL_CAPSULE | ORAL | 0 refills | Status: DC

## 2020-06-25 NOTE — Progress Notes (Signed)
Chief Complaint:   OBESITY Monica York is here to discuss her progress with her obesity treatment plan along with follow-up of her obesity related diagnoses. Monica York is on the Pescatarian Plan + 300 calories and states she is following her eating plan approximately 60% of the time. Monica York states she is exercising with work out videos for 60 minutes 1 time per week.  Today's visit was #: 6 Starting weight: 250 lbs Starting date: 04/02/2020 Today's weight: 237 lbs Today's date: 06/21/2020 Total lbs lost to date: 13 Total lbs lost since last in-office visit: 0  Interim History: Monica York voices she has had some personal things going on and so she hasn't been able to eat on the plan as much as she would have liked. She has food in the house for the Pescatarian plan. She is going to the beach for Thanksgiving. Her biggest obstacle in the next few weeks is Thanksgiving and weekends.  Subjective:   1. Vitamin D deficiency Monica York denies nausea, vomiting, or muscle weakness, but notes fatigue. She is on prescription Vit D. Last vit D level was 28.3.  2. Other iron deficiency anemia Monica York's iron saturation has decreased. Her MCV has decreased to 76, but ferritin and iron are within normal limits. She notes fatigue.  3. At risk for osteoporosis Monica York is at higher risk of osteopenia and osteoporosis due to Vitamin D deficiency.   Assessment/Plan:   1. Vitamin D deficiency Low Vitamin D level contributes to fatigue and are associated with obesity, breast, and colon cancer. We will refill prescription Vitamin D for 1 month. Monica York will follow-up for routine testing of Vitamin D, at least 2-3 times per year to avoid over-replacement.  - Vitamin D, Ergocalciferol, (DRISDOL) 1.25 MG (50000 UNIT) CAPS capsule; Take 1 capsule (50,000 Units total) by mouth every 7 (seven) days.  Dispense: 4 capsule; Refill: 0  2. Other iron deficiency anemia [name] was encouraged to take OTC iron or prenatal vitamins. Orders and  follow up as documented in patient record.  Counseling . Iron is essential for our bodies to make red blood cells.  Reasons that someone may be deficient include: an iron-deficient diet (more likely in those following vegan or vegetarian diets), women with heavy menses, patients with GI disorders or poor absorption, patients that have had bariatric surgery, frequent blood donors, patients with cancer, and patients with heart disease.   Monica York foods include dark leafy greens, red and white meats, eggs, seafood, and beans.   . Certain foods and drinks prevent your body from absorbing iron properly. Avoid eating these foods in the same meal as iron-rich foods or with iron supplements. These foods include: coffee, black tea, and red wine; milk, dairy products, and foods that are high in calcium; beans and soybeans; whole grains.  . Constipation can be a side effect of iron supplementation. Increased water and fiber intake are helpful. Water goal: > 2 liters/day. Fiber goal: > 25 grams/day.  3. At risk for osteoporosis Monica York was given approximately 15 minutes of osteoporosis prevention counseling today. Monica York is at risk for osteopenia and osteoporosis due to her Vitamin D deficiency. She was encouraged to take her Vitamin D and follow her higher calcium diet and increase strengthening exercise to help strengthen her bones and decrease her risk of osteopenia and osteoporosis.  Repetitive spaced learning was employed today to elicit superior memory formation and behavioral change.  4. Class 2 severe obesity with serious comorbidity and body mass index (BMI) of 36.0  to 36.9 in adult, unspecified obesity type (HCC) Monica York is currently in the action stage of change. As such, her goal is to continue with weight loss efforts. She has agreed to keeping a food journal and adhering to recommended goals of 1500-1650 calories and 95+ grams of protein daily or the Pescatarian Plan + 300 calories.   Exercise goals:  As is.  Behavioral modification strategies: increasing lean protein intake, meal planning and cooking strategies, keeping healthy foods in the home, travel eating strategies, holiday eating strategies  and keeping a strict food journal.  Monica York has agreed to follow-up with our clinic in 2 to 3 weeks with Dr. Sharee Holster. She was informed of the importance of frequent follow-up visits to maximize her success with intensive lifestyle modifications for her multiple health conditions.   Objective:   Blood pressure 112/74, pulse 80, temperature 98 F (36.7 C), temperature source Oral, height 5\' 8"  (1.727 m), weight 237 lb (107.5 kg), SpO2 99 %. Body mass index is 36.04 kg/m.  General: Cooperative, alert, well developed, in no acute distress. HEENT: Conjunctivae and lids unremarkable. Cardiovascular: Regular rhythm.  Lungs: Normal work of breathing. Neurologic: No focal deficits.   Lab Results  Component Value Date   CREATININE 0.81 04/02/2020   BUN 11 04/02/2020   NA 142 04/02/2020   K 4.5 04/02/2020   CL 102 04/02/2020   CO2 25 04/02/2020   Lab Results  Component Value Date   ALT 15 04/02/2020   AST 18 04/02/2020   ALKPHOS 106 04/02/2020   BILITOT 0.6 04/02/2020   Lab Results  Component Value Date   HGBA1C 5.5 04/02/2020   Lab Results  Component Value Date   INSULIN 16.0 04/02/2020   Lab Results  Component Value Date   TSH 0.973 04/02/2020   Lab Results  Component Value Date   CHOL 154 04/02/2020   HDL 52 04/02/2020   LDLCALC 89 04/02/2020   TRIG 67 04/02/2020   Lab Results  Component Value Date   WBC 4.4 05/31/2020   HGB 14.6 05/31/2020   HCT 42.6 05/31/2020   MCV 76 (L) 05/31/2020   PLT 318 05/31/2020   Lab Results  Component Value Date   IRON 56 05/31/2020   TIBC 415 05/31/2020   FERRITIN 34 05/31/2020   Attestation Statements:   Reviewed by clinician on day of visit: allergies, medications, problem list, medical history, surgical history, family  history, social history, and previous encounter notes.   I, 06/02/2020, am acting as transcriptionist for Burt Knack, MD.  I have reviewed the above documentation for accuracy and completeness, and I agree with the above. - Reuben Likes, MD

## 2020-06-28 ENCOUNTER — Telehealth (HOSPITAL_COMMUNITY): Payer: Self-pay | Admitting: Family Medicine

## 2020-06-28 NOTE — Telephone Encounter (Signed)
Just an FYI. We have made several attempts to contact this patient including sending a letter to schedule or reschedule their echocardiogram. We will be removing the patient from the echo WQ.   06/13/20 PT NO SHOWED - MAILED LETTER [facility]  06/14/20 Left SMS message due to VM full to schedule @ 10:12/[facility]     Thank you

## 2020-07-11 ENCOUNTER — Encounter (INDEPENDENT_AMBULATORY_CARE_PROVIDER_SITE_OTHER): Payer: Self-pay | Admitting: Family Medicine

## 2020-07-11 ENCOUNTER — Ambulatory Visit (INDEPENDENT_AMBULATORY_CARE_PROVIDER_SITE_OTHER): Payer: BC Managed Care – PPO | Admitting: Family Medicine

## 2020-07-11 ENCOUNTER — Other Ambulatory Visit: Payer: Self-pay

## 2020-07-11 VITALS — BP 104/65 | HR 62 | Temp 98.0°F | Ht 68.0 in | Wt 238.0 lb

## 2020-07-11 DIAGNOSIS — E559 Vitamin D deficiency, unspecified: Secondary | ICD-10-CM | POA: Diagnosis not present

## 2020-07-11 DIAGNOSIS — D508 Other iron deficiency anemias: Secondary | ICD-10-CM

## 2020-07-11 DIAGNOSIS — Z9189 Other specified personal risk factors, not elsewhere classified: Secondary | ICD-10-CM

## 2020-07-11 DIAGNOSIS — Z6836 Body mass index (BMI) 36.0-36.9, adult: Secondary | ICD-10-CM

## 2020-07-11 DIAGNOSIS — D649 Anemia, unspecified: Secondary | ICD-10-CM | POA: Insufficient documentation

## 2020-07-11 MED ORDER — VITAMIN D (ERGOCALCIFEROL) 1.25 MG (50000 UNIT) PO CAPS: 50000.0000 [IU] | ORAL_CAPSULE | ORAL | 0 refills | Status: DC

## 2020-07-11 MED ORDER — FERROUS SULFATE 325 (65 FE) MG PO TBEC: 325.0000 mg | DELAYED_RELEASE_TABLET | Freq: Every day | ORAL | 0 refills | Status: DC

## 2020-07-16 NOTE — Progress Notes (Signed)
Chief Complaint:   OBESITY Monica York is here to discuss her progress with her obesity treatment plan along with follow-up of her obesity related diagnoses. Monica York is on keeping a food journal and adhering to recommended goals of 1500-1650 calories and 95+ grams of protein or the Pescatarian Plan +300 calories and states she is following her eating plan approximately 75% of the time. Monica York states she is doing YouTube cardio for 60 minutes 2 times per week.  Today's visit was #: 7 Starting weight: 250 lbs Starting date: 04/02/2020 Today's weight: 238 lbs Today's date: 07/11/2020 Total lbs lost to date: 12 lbs Total lbs lost since last in-office visit: +1 lb Total weight loss percentage to date: -4.80%  Interim History: Monica York just got her menses yesterday, which is the likely reason for her 1 pound weight gain.  She says she is following the Pescatarian plan +300 calories and eats chicken or [place].  On weekends, she falls off, especially when traveling to family/friend's houses.  She has no strategy.  Assessment/Plan:   1. Other iron deficiency anemia Not taking any OTC supplement, although was told to at last visit with Dr. Lawson Radar.  Plan:  Start ferrous sulfate, as per below.  Counseled on iron deficiency.   CBC Latest Ref Rng & Units 05/31/2020 04/02/2020  WBC 3.4 - 10.8 x10E3/uL 4.4 6.4  Hemoglobin 11.1 - 15.9 g/dL 29.9 24.2  Hematocrit 68.3 - 46.6 % 42.6 41.4  Platelets 150 - 450 x10E3/uL 318 321   Lab Results  Component Value Date   IRON 56 05/31/2020   TIBC 415 05/31/2020   FERRITIN 34 05/31/2020   Lab Results  Component Value Date   VITAMINB12 852 05/31/2020   -Start ferrous sulfate 325 (65 FE) MG EC tablet; Take 1 tablet (325 mg total) by mouth daily with breakfast.  Dispense: 30 tablet; Refill: 0  2. Vitamin D deficiency Monica York's Vitamin D level was 28.3 on 04/02/2020. She is currently taking prescription vitamin D 50,000 IU each week. She denies nausea, vomiting or  muscle weakness.  Tolerating well without side effects.  Plan:  Continue weekly vitamin D, as per below.  -Refill Vitamin D, Ergocalciferol, (DRISDOL) 1.25 MG (50000 UNIT) CAPS capsule; Take 1 capsule (50,000 Units total) by mouth every 7 (seven) days.  Dispense: 4 capsule; Refill: 0  3. At risk for deficient intake of food Monica York was given approximately 9 minutes of deficit intake of food prevention counseling today. Monica York is at risk for eating too few calories based on current food recall. She was encouraged to focus on meeting caloric and protein goals according to her recommended meal plan.   4. Class 2 severe obesity with serious comorbidity and body mass index (BMI) of 36.0 to 36.9 in adult, unspecified obesity type (HCC)  Monica York is currently in the action stage of change. As such, her goal is to continue with weight loss efforts. She has agreed to keeping a food journal and adhering to recommended goals of 1500-1650 calories and 95+ grams of protein or the Pescatarian Plan +300 calories.   Exercise goals: For substantial health benefits, adults should do at least 150 minutes (2 hours and 30 minutes) a week of moderate-intensity, or 75 minutes (1 hour and 15 minutes) a week of vigorous-intensity aerobic physical activity, or an equivalent combination of moderate- and vigorous-intensity aerobic activity. Aerobic activity should be performed in episodes of at least 10 minutes, and preferably, it should be spread throughout the week.  Behavioral modification strategies:  increasing lean protein intake, decreasing simple carbohydrates, meal planning and cooking strategies and planning for success (bringing lunch daily and planning meals).  Monica York has agreed to follow-up with our clinic in 2-3 weeks. She was informed of the importance of frequent follow-up visits to maximize her success with intensive lifestyle modifications for her multiple health conditions.   Objective:   Blood pressure 104/65,  pulse 62, temperature 98 F (36.7 C), height 5\' 8"  (1.727 m), weight 238 lb (108 kg), SpO2 96 %. Body mass index is 36.19 kg/m.  General: Cooperative, alert, well developed, in no acute distress. HEENT: Conjunctivae and lids unremarkable. Cardiovascular: Regular rhythm.  Lungs: Normal work of breathing. Neurologic: No focal deficits.   Lab Results  Component Value Date   CREATININE 0.81 04/02/2020   BUN 11 04/02/2020   NA 142 04/02/2020   K 4.5 04/02/2020   CL 102 04/02/2020   CO2 25 04/02/2020   Lab Results  Component Value Date   ALT 15 04/02/2020   AST 18 04/02/2020   ALKPHOS 106 04/02/2020   BILITOT 0.6 04/02/2020   Lab Results  Component Value Date   HGBA1C 5.5 04/02/2020   Lab Results  Component Value Date   INSULIN 16.0 04/02/2020   Lab Results  Component Value Date   TSH 0.973 04/02/2020   Lab Results  Component Value Date   CHOL 154 04/02/2020   HDL 52 04/02/2020   LDLCALC 89 04/02/2020   TRIG 67 04/02/2020   Lab Results  Component Value Date   WBC 4.4 05/31/2020   HGB 14.6 05/31/2020   HCT 42.6 05/31/2020   MCV 76 (L) 05/31/2020   PLT 318 05/31/2020   Lab Results  Component Value Date   IRON 56 05/31/2020   TIBC 415 05/31/2020   FERRITIN 34 05/31/2020   Attestation Statements:   Reviewed by clinician on day of visit: allergies, medications, problem list, medical history, surgical history, family history, social history, and previous encounter notes.  I, 06/02/2020, CMA, am acting as Insurance claims handler for Energy manager, DO.  I have reviewed the above documentation for accuracy and completeness, and I agree with the above. [employer], D.O.  The 21st Century Cures Act was signed into law in 2016 which includes the topic of electronic health records.  This provides immediate access to information in MyChart.  This includes consultation notes, operative notes, office notes, lab results and pathology reports.  If you have any  questions about what you read please let 2017 know at your next visit so we can discuss your concerns and take corrective action if need be.  We are right here with you.

## 2020-08-01 ENCOUNTER — Ambulatory Visit (INDEPENDENT_AMBULATORY_CARE_PROVIDER_SITE_OTHER): Payer: BC Managed Care – PPO | Admitting: Family Medicine

## 2020-08-06 ENCOUNTER — Ambulatory Visit (INDEPENDENT_AMBULATORY_CARE_PROVIDER_SITE_OTHER): Payer: BC Managed Care – PPO | Admitting: Family Medicine

## 2023-02-09 DIAGNOSIS — Z0289 Encounter for other administrative examinations: Secondary | ICD-10-CM

## 2023-03-04 ENCOUNTER — Ambulatory Visit (INDEPENDENT_AMBULATORY_CARE_PROVIDER_SITE_OTHER): Payer: Self-pay | Admitting: Family Medicine

## 2023-03-04 ENCOUNTER — Encounter (INDEPENDENT_AMBULATORY_CARE_PROVIDER_SITE_OTHER): Payer: Self-pay | Admitting: Family Medicine

## 2023-03-04 VITALS — BP 125/86 | HR 68 | Temp 98.3°F | Ht 68.0 in | Wt 253.0 lb

## 2023-03-04 DIAGNOSIS — Z6838 Body mass index (BMI) 38.0-38.9, adult: Secondary | ICD-10-CM

## 2023-03-04 DIAGNOSIS — Z1331 Encounter for screening for depression: Secondary | ICD-10-CM | POA: Diagnosis not present

## 2023-03-04 DIAGNOSIS — F5089 Other specified eating disorder: Secondary | ICD-10-CM | POA: Diagnosis not present

## 2023-03-04 DIAGNOSIS — R5383 Other fatigue: Secondary | ICD-10-CM

## 2023-03-04 DIAGNOSIS — E559 Vitamin D deficiency, unspecified: Secondary | ICD-10-CM

## 2023-03-04 DIAGNOSIS — R0602 Shortness of breath: Secondary | ICD-10-CM

## 2023-03-04 DIAGNOSIS — E282 Polycystic ovarian syndrome: Secondary | ICD-10-CM | POA: Diagnosis not present

## 2023-03-04 DIAGNOSIS — E66812 Obesity, class 2: Secondary | ICD-10-CM

## 2023-03-04 NOTE — Progress Notes (Signed)
[name], D.O.  ABFM, ABOM Specializing in Clinical Bariatric Medicine Office located at: [address] [place], [place]  [address]     Bariatric Medicine Visit  Dear Monica Height, PA or Monica York, [place], [place],   Thank you for referring Monica York to our clinic today for evaluation.  We performed a consultation to discuss her options for treatment and educate the patient on her disease state.  The following note includes my evaluation and treatment recommendations.   Please do not hesitate to reach out to me directly if you have any further concerns.    Assessment and Plan:  Check labs next OV Orders Placed This Encounter  Procedures   CBC with Differential/Platelet   VITAMIN D 25 Hydroxy (Vit-D Deficiency, Fractures)   Comprehensive metabolic panel   Folate   Hemoglobin A1c   Insulin, random   Lipid Panel With LDL/HDL Ratio   T4, free   TSH   Vitamin B12   EKG 12-Lead    Medications Discontinued During This Encounter  Medication Reason   ferrous sulfate 325 (65 FE) MG EC tablet    Vitamin D, Ergocalciferol, (DRISDOL) 1.25 MG (50000 UNIT) CAPS capsule     1) Fatigue Assessment: Condition is Uncontrolled. Monica York does feel that her weight is causing her energy to be lower than it should be. Fatigue may be related to obesity, depression or many other causes. she does not appear to have any red flag symptoms and this appears to most likely be related to her current lifestyle habits and dietary intake.  Plan:  Labs will be ordered and reviewed with her at their next office visit in two weeks. Epworth sleepiness scale score appears to be within normal limits.  Her ESS score is 9.   Monica York denies daytime somnolence and denies waking up still tired. Patient has a history of symptoms of daytime fatigue. Monica York generally gets 6 or 7 hours of sleep per night, and states that she has generally restful sleep. Snoring may be present. Apneic episodes are not present.  Patient denies any concern with her sleep. ECG: Performed and reviewed/ interpreted independently.  Normal sinus rhythm, rate 62bpm; reassuring without any acute abnormalities, will continue to monitor for symptoms  Modified PHQ-9 Depression Screen: Her Food and Mood (modified PHQ-9) score was 9. In the meanwhile, Monica York will focus on self care including making healthy food choices by following their meal plan, improving sleep quality and focusing on stress reduction.  Once we are assured she is on an appropriate meal plan, we will start discussing exercise to increase cardiovascular fitness levels.   2) Shortness of breath on exertion Assessment: Condition is Uncontrolled. Monica York does feel that she gets out of breath more easily than she used to when she exercises and seems to be worsening over time with weight gain.  This has gotten worse recently. Monica York denies shortness of breath at rest or orthopnea. Monica York's shortness of breath appears to be obesity related and exercise induced, as they do not appear to have any "red flag" symptoms/ concerns today.  Also, this condition appears to be related to a state of poor cardiovascular conditioning   Plan:  Obtain labs today and will be reviewed with her at their next office visit in two weeks. Indirect Calorimeter completed today to help guide our dietary regimen. It shows a VO2 of 282 and a REE of 1944.  Her calculated basal metabolic rate is 6045 thus her measured basal metabolic  rate is worse than expected. Patient agreed to work on weight loss at this time.  As [name] progresses through our weight loss program, we will gradually increase exercise as tolerated to treat her current condition.   If [name] follows our recommendations and loses 5-10% of their weight without improvement of her shortness of breath or if at any time, symptoms become more concerning, they agree to urgently follow up with their PCP/ specialist for further consideration/ evaluation.    Monica York verbalizes agreement with this plan.   PCOS (polycystic ovarian syndrome) Intensive lifestyle modifications are first line treatment for this issue. We discussed several lifestyle modifications today and she will continue to work on diet, exercise and weight loss efforts. Orders and follow up as documented in patient record. Assessment: Condition is Not optimized. She was clinically diagnosed with PCOS and recommended to start Metformin in February, 2024 but declined. She has not had a period since February, 2022.   Plan: Will Check lab today and adjust treatment as necessary. I recommended that she follow up with a gynocologist as needed for symptom control and treatment for her amenorrhea and PCOS. She will establish with one here in [place] in the near furture and or have this managed by her PCP.     Vitamin D deficiency Assessment: Condition is Not at goal. Her vitamin D level is below the recommended range of 50-80. Pt has not been regular with OTC vitamin D supplement but thinks that she is taking 1000lU.  Lab Results  Component Value Date   VD25OH 28.3 (L) 04/02/2020   Plan: Recheck labs. Continue OTC vitamin D supplement more consistently.  - weight loss will likely improve availability of vitamin D, thus encouraged [name] to continue with meal plan and their weight loss efforts to further improve this condition.  Thus, we will need to monitor levels regularly (every 3-4 mo on average) to keep levels within normal limits and prevent over supplementation.   Other disorder of eating-emotional eating Assessment: Condition is Not optimized. She tends to eat when stressed, sad, bored, for comfort, or as an award.  Denies any SI/HI. Mood is stable. She has no history of anxiety or depression and has never been placed on anxiety/depression medication.  Cravings and hunger are semi controlled, she doesn't snack but craves fried foods. Pt suspects she may have a binge eating disorder and  she tends to over eat when eating out. Her increased weight occasionally makes her feel depressed, effects her sleep, and decreases her interest in certain things.   Plan: Patient was referred to Dr. Dewaine Conger, our Bariatric Psychologist, for evaluation due to her elevated PHQ-9 score and significant struggles with emotional eating. Pt declines seeing Dr. Dewaine Conger at this time but we will continue to monitor this and follow with an appointment if appropriate at this time. Behavior modification techniques were discussed today to help deal with emotional/ non-hunger eating behaviors including but not limited to adequate sleep (7-9 hrs/nite). Reminded patient of the importance of following their prudent nutrition plan and how food can affect mood as well to support emotional wellbeing. We will continue to monitor closely alongside PCP / other specialists.   TREATMENT PLAN FOR OBESITY:  Assessment: Condition is Not optimized.. Biometric data collected today, was reviewed with patient.  Muscle mass is 139.6lb. Fat mass is 106.4lb.Total body water is 92.8lb.   Plan: Begin category 2 meal plan with breakfast and lunch options.    Behavioral Intervention Additional resources provided today: category 2 meal  plan information and breakfast and lunch options Evidence-based interventions for health behavior change were utilized today including the discussion of self monitoring techniques, problem-solving barriers and SMART goal setting techniques.   Regarding patient's less desirable eating habits and patterns, we employed the technique of small changes.  Pt will specifically work on: eat 4-6oz of lean protein during lunch for next visit.    Recommended Physical Activity Goals Monica York has been advised to gradually work up to 150 minutes of moderate intensity aerobic activity a week and strengthening exercises 2-3 times per week for cardiovascular health, weight loss maintenance and preservation of muscle mass.  She  has agreed to continue their current level of activity  FOLLOW UP: Follow up in 2 weeks. She was informed of the importance of frequent follow up visits to maximize her success with intensive lifestyle modifications for her multiple health conditions. Monica York is aware that we will review all of her lab results at our next visit.  She is aware that if anything is critical/ life threatening with the results, we will be contacting her via MyChart prior to the office visit to discuss management.   Monica York is aware that we will review all of her lab results at our next visit.  She is aware that if anything is critical/ life threatening with the results, we will be contacting her via MyChart prior to the office visit to discuss management.    Chief Complaint:   OBESITY Monica York (MR# [id number]) is a 27 y.o. female who presents for evaluation and treatment of obesity and related comorbidities. Current BMI is Body mass index is 38.47 kg/m. Monica York has been struggling with her weight for many years and has been unsuccessful in either losing weight, maintaining weight loss, or reaching her healthy weight goal.  Monica York is currently in the action stage of change and ready to dedicate time achieving and maintaining a healthier weight. Monica York is interested in becoming our patient and working on intensive lifestyle modifications including (but not limited to) diet and exercise for weight loss.  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 Patient is single and does not have children. She lives with her mom and dad.  Alainna Aber's habits were reviewed today and are as follows: her desired weight loss is 200lbs by 11/2023. 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 In the past the best dieting plan that works for her was low carb and low calorie plans as she does like meats and  veggies. She does eat out about 3 times a week as she does not like the time it takes to cook when she lives on her own. Pt skips breakfast most days and rarely snacks but craves fried foods. She believes her worst habit is over eating/portion control especially when eating out. She tends to eat until she is stuffed. She does not drink soda but does drink coffee with creamer and protein drinks. She currently exercises by cardio and weightlifting 3 times weekly.    Subjective:   This is the patient's first visit at [facility].  The patient's NEW PATIENT PACKET that they filled out prior to today's office visit was reviewed at length and information from that paperwork was included within the following office visit note.    Included in the packet: current and past health history, medications, allergies, ROS,  gynecologic history (women only), surgical history, family history, social history, weight history, weight loss surgery history (for those that have had weight loss surgery), nutritional evaluation, mood and food questionnaire along with a depression screening (PHQ9) on all patients, an Epworth questionnaire, sleep habits questionnaire, patient life and health improvement goals questionnaire. These will all be scanned into the patient's chart under the "media" tab.   Review of Systems: Please refer to new patient packet scanned into media. Pertinent positives were addressed with patient today.  Reviewed by clinician on day of visit: allergies, medications, problem list, medical history, surgical history, family history, social history, and previous encounter notes.  During the visit, I independently reviewed the patient's EKG, bioimpedance scale results, and indirect calorimeter results. I used this information to tailor a meal plan for the patient that will help Monica York to lose weight and will improve her obesity-related conditions going forward.  I performed a  medically necessary appropriate examination and/or evaluation. I discussed the assessment and treatment plan with the patient. The patient was provided an opportunity to ask questions and all were answered. The patient agreed with the plan and demonstrated an understanding of the instructions. Labs were ordered today (unless patient declined them) and will be reviewed with the patient at our next visit unless more critical results need to be addressed immediately. Clinical information was updated and documented in the EMR.   Objective:   PHYSICAL EXAM: Blood pressure 125/86, pulse 68, temperature 98.3 F (36.8 C), York 5\' 8"  (1.727 m), weight 253 lb (114.8 kg), SpO2 99%. Body mass index is 38.47 kg/m. General: Well Developed, well nourished, and in no acute distress.  HEENT: Normocephalic, atraumatic Skin: Warm and dry, cap RF less 2 sec, good turgor Chest:  Normal excursion, shape, no gross abn Respiratory: speaking in full sentences, no conversational dyspnea NeuroM-Sk: Ambulates w/o assistance, moves * 4 Psych: A and O *3, insight good, mood-full  Anthropometric Measurements York: 5\' 8"  (1.727 m) Weight: 253 lb (114.8 kg) BMI (Calculated): 38.48 Weight at Last Visit: na Weight Lost Since Last Visit: na Weight Gained Since Last Visit: na Starting Weight: 253lb Peak Weight: 275lb Waist Measurement : 39 inches   No data recorded Other Clinical Data RMR: 1945 Fasting: yes Labs: yes Today's Visit #: 1 Starting Date: 03/04/23 Comments: first visit    DIAGNOSTIC DATA REVIEWED:  BMET    Component Value Date/Time   NA 142 04/02/2020 1409   K 4.5 04/02/2020 1409   CL 102 04/02/2020 1409   CO2 25 04/02/2020 1409   GLUCOSE 82 04/02/2020 1409   BUN 11 04/02/2020 1409   CREATININE 0.81 04/02/2020 1409   CALCIUM 9.7 04/02/2020 1409   GFRNONAA 101 04/02/2020 1409   GFRAA 117 04/02/2020 1409   Lab Results  Component Value Date   HGBA1C 5.5 04/02/2020   Lab Results   Component Value Date   INSULIN 16.0 04/02/2020   Lab Results  Component Value Date   TSH 0.973 04/02/2020   CBC    Component Value Date/Time   WBC 4.4 05/31/2020 1421   RBC 5.58 (H) 05/31/2020 1421   HGB 14.6 05/31/2020 1421   HCT 42.6 05/31/2020 1421   PLT 318 05/31/2020 1421   MCV 76 (L) 05/31/2020 1421   MCH 26.2 (L) 05/31/2020 1421   MCHC 34.3 05/31/2020 1421   RDW 14.4 05/31/2020 1421   Iron Studies    Component Value Date/Time   IRON 56 05/31/2020 1421   TIBC 415 05/31/2020 1421  FERRITIN 34 05/31/2020 1421   IRONPCTSAT 13 (L) 05/31/2020 1421   Lipid Panel     Component Value Date/Time   CHOL 154 04/02/2020 1409   TRIG 67 04/02/2020 1409   HDL 52 04/02/2020 1409   LDLCALC 89 04/02/2020 1409   Hepatic Function Panel     Component Value Date/Time   PROT 7.9 04/02/2020 1409   ALBUMIN 4.5 04/02/2020 1409   AST 18 04/02/2020 1409   ALT 15 04/02/2020 1409   ALKPHOS 106 04/02/2020 1409   BILITOT 0.6 04/02/2020 1409      Component Value Date/Time   TSH 0.973 04/02/2020 1409   Nutritional Lab Results  Component Value Date   VD25OH 28.3 (L) 04/02/2020    Attestation Statements:  Patient was in the office today and time spent on visit including pre-visit chart review and post-visit care/coordination of care and electronic medical record documentation was 63 minutes. 50% of the time was in face to face counseling of this patient's medical condition(s) and providing education on treatment options to include the first-line treatment of diet and lifestyle modification.  I, Clinical biochemist, acting as a Stage manager for [employer], DO., have compiled all relevant documentation for today's office visit on behalf of Thomasene Lot, DO, while in the presence of [employer], DO.  Time spent on visit including pre-visit chart review and post-visit care was estimated to be 60 *** minutes. Over 50% of the time was spent in direct face to face counseling and  coordination of care.  I have reviewed the above documentation for accuracy and completeness, and I agree with the above. [name], D.O.  The 21st Century Cures Act was signed into law in 2016 which includes the topic of electronic health records.  This provides immediate access to information in MyChart.  This includes consultation notes, operative notes, office notes, lab results and pathology reports.  If you have any questions about what you read please let us know at your next visit so we can discuss your concerns and take corrective action if need be.  We are right here with you.

## 2023-03-05 LAB — CBC WITH DIFFERENTIAL/PLATELET
Basophils Absolute: 0 10*3/uL (ref 0.0–0.2)
Basos: 1 %
EOS (ABSOLUTE): 0.1 10*3/uL (ref 0.0–0.4)
Eos: 2 %
Hematocrit: 40.6 % (ref 34.0–46.6)
Hemoglobin: 13.2 g/dL (ref 11.1–15.9)
Immature Grans (Abs): 0 10*3/uL (ref 0.0–0.1)
Immature Granulocytes: 0 %
Lymphocytes Absolute: 1.3 10*3/uL (ref 0.7–3.1)
Lymphs: 29 %
MCH: 25.4 pg — ABNORMAL LOW (ref 26.6–33.0)
MCHC: 32.5 g/dL (ref 31.5–35.7)
MCV: 78 fL — ABNORMAL LOW (ref 79–97)
Monocytes Absolute: 0.6 10*3/uL (ref 0.1–0.9)
Monocytes: 14 %
Neutrophils Absolute: 2.4 10*3/uL (ref 1.4–7.0)
Neutrophils: 54 %
Platelets: 278 10*3/uL (ref 150–450)
RBC: 5.19 x10E6/uL (ref 3.77–5.28)
RDW: 14.4 % (ref 11.7–15.4)
WBC: 4.5 10*3/uL (ref 3.4–10.8)

## 2023-03-05 LAB — COMPREHENSIVE METABOLIC PANEL
ALT: 20 IU/L (ref 0–32)
AST: 20 IU/L (ref 0–40)
Albumin: 4.6 g/dL (ref 4.0–5.0)
Alkaline Phosphatase: 93 IU/L (ref 44–121)
BUN/Creatinine Ratio: 13 (ref 9–23)
BUN: 12 mg/dL (ref 6–20)
Bilirubin Total: 0.9 mg/dL (ref 0.0–1.2)
CO2: 22 mmol/L (ref 20–29)
Calcium: 9.7 mg/dL (ref 8.7–10.2)
Chloride: 102 mmol/L (ref 96–106)
Creatinine, Ser: 0.93 mg/dL (ref 0.57–1.00)
Globulin, Total: 2.8 g/dL (ref 1.5–4.5)
Glucose: 73 mg/dL (ref 70–99)
Potassium: 4.6 mmol/L (ref 3.5–5.2)
Sodium: 140 mmol/L (ref 134–144)
Total Protein: 7.4 g/dL (ref 6.0–8.5)
eGFR: 86 mL/min/{1.73_m2} (ref >59)

## 2023-03-05 LAB — HEMOGLOBIN A1C
Est. average glucose Bld gHb Est-mCnc: 117 mg/dL
Hgb A1c MFr Bld: 5.7 % — ABNORMAL HIGH (ref 4.8–5.6)

## 2023-03-05 LAB — LIPID PANEL WITH LDL/HDL RATIO
Cholesterol, Total: 152 mg/dL (ref 100–199)
HDL: 45 mg/dL (ref >39)
LDL Chol Calc (NIH): 95 mg/dL (ref 0–99)
LDL/HDL Ratio: 2.1 ratio (ref 0.0–3.2)
Triglycerides: 59 mg/dL (ref 0–149)
VLDL Cholesterol Cal: 12 mg/dL (ref 5–40)

## 2023-03-05 LAB — INSULIN, RANDOM: INSULIN: 6.1 u[IU]/mL (ref 2.6–24.9)

## 2023-03-05 LAB — TSH: TSH: 1.05 u[IU]/mL (ref 0.450–4.500)

## 2023-03-05 LAB — VITAMIN D 25 HYDROXY (VIT D DEFICIENCY, FRACTURES): Vit D, 25-Hydroxy: 32.1 ng/mL (ref 30.0–100.0)

## 2023-03-05 LAB — VITAMIN B12: Vitamin B-12: 848 pg/mL (ref 232–1245)

## 2023-03-05 LAB — T4, FREE: Free T4: 1.27 ng/dL (ref 0.82–1.77)

## 2023-03-05 LAB — FOLATE: Folate: 10.3 ng/mL (ref >3.0)

## 2023-03-18 ENCOUNTER — Ambulatory Visit (INDEPENDENT_AMBULATORY_CARE_PROVIDER_SITE_OTHER): Payer: BC Managed Care – PPO | Admitting: Family Medicine

## 2023-03-19 ENCOUNTER — Ambulatory Visit (INDEPENDENT_AMBULATORY_CARE_PROVIDER_SITE_OTHER): Payer: Self-pay | Admitting: Family Medicine

## 2023-03-19 ENCOUNTER — Encounter (INDEPENDENT_AMBULATORY_CARE_PROVIDER_SITE_OTHER): Payer: Self-pay | Admitting: Family Medicine

## 2023-03-19 VITALS — BP 112/79 | HR 72 | Temp 97.9°F | Ht 68.0 in | Wt 247.0 lb

## 2023-03-19 DIAGNOSIS — Z6837 Body mass index (BMI) 37.0-37.9, adult: Secondary | ICD-10-CM

## 2023-03-19 DIAGNOSIS — R7303 Prediabetes: Secondary | ICD-10-CM

## 2023-03-19 DIAGNOSIS — E559 Vitamin D deficiency, unspecified: Secondary | ICD-10-CM | POA: Diagnosis not present

## 2023-03-19 DIAGNOSIS — Z6838 Body mass index (BMI) 38.0-38.9, adult: Secondary | ICD-10-CM

## 2023-03-19 MED ORDER — VITAMIN D (ERGOCALCIFEROL) 1.25 MG (50000 UNIT) PO CAPS
50000.0000 [IU] | ORAL_CAPSULE | ORAL | 0 refills | Status: DC
Start: 2023-03-19 — End: 2023-04-16

## 2023-03-19 NOTE — Progress Notes (Signed)
Monica York, D.O.  ABFM, ABOM Clinical Bariatric Medicine Physician  Office located at: [address] [place], [place]  [address]     Assessment and Plan:   Meds ordered this encounter  Medications   Vitamin D, Ergocalciferol, (DRISDOL) 1.25 MG (50000 UNIT) CAPS capsule    Sig: Take 1 capsule (50,000 Units total) by mouth every 7 (seven) days.    Dispense:  4 capsule    Refill:  0     Prediabetes  Assessment & Plan:  Condition is new and not optimized. Her A1c levels are within the prediabetic range. Insulin levels are slightly elevated (1.22 times normal). Blood counts are stable and not indicative of anemia. Kidney function, electrolytes and liver enzymes are within normal limits. Folate, B12, and Thyroid levels are also normal. No concerns with results from Lipid panel.   Lab Results  Component Value Date   HGBA1C 5.7 (H) 03/04/2023   HGBA1C 5.5 04/02/2020   INSULIN 6.1 03/04/2023   INSULIN 16.0 04/02/2020   Lab Results  Component Value Date   WBC 4.5 03/04/2023   HGB 13.2 03/04/2023   HCT 40.6 03/04/2023   MCV 78 (L) 03/04/2023   PLT 278 03/04/2023   Lab Results  Component Value Date   CREATININE 0.93 03/04/2023   BUN 12 03/04/2023   NA 140 03/04/2023   K 4.6 03/04/2023   CL 102 03/04/2023   CO2 22 03/04/2023      Component Value Date/Time   PROT 7.4 03/04/2023 1203   ALBUMIN 4.6 03/04/2023 1203   AST 20 03/04/2023 1203   ALT 20 03/04/2023 1203   ALKPHOS 93 03/04/2023 1203   BILITOT 0.9 03/04/2023 1203   Lab Results  Component Value Date   VITAMINB12 848 03/04/2023   FOLATE 10.3 03/04/2023   Lab Results  Component Value Date   TSH 1.050 03/04/2023   FREET4 1.27 03/04/2023      Component Value Date/Time   CHOL 152 03/04/2023 1203   TRIG 59 03/04/2023 1203   HDL 45 03/04/2023 1203   LDLCALC 95 03/04/2023 1203   LABVLDL 12 03/04/2023 1203   Labs were reviewed with pt today and education provided on them and how the foods patient  eats may influence these findings. All questions were answered about them.  Unless pre-existing renal or cardiopulmonary conditions exist which patient was told to limit their fluid intake by another provider, I recommended roughly one half of their weight in pounds, to be the approximate ounces of non-caloric, non-caffeinated beverages they should drink per day; including more if they are engaging in exercise (1 extra bottle for every 30 minutes of exercise)   I counseled patient on pathophysiology of the disease process of Pre-DM. Prediabetes Handout provided to pt. Educated patient that having protein with each meal is important for stabilizing sugars and an important part of pre diabetes management as well as controlling hunger and cravings. Monica York will continue to work on weight loss, exercise, via their meal plan we devised to help decrease the risk of progressing to diabetes.    Vitamin D deficiency Assessment & Plan:  Pt currently not taking any supplement for this condition. Labs below show that her Vitamin D levels are not within the recommended range of 50-70/80.   Lab Results  Component Value Date   VD25OH 32.1 03/04/2023   VD25OH 28.3 (L) 04/02/2020   Labs were reviewed with patient today and education provided on them.   All of the patient's  questions about them were answered    I discussed the importance of vitamin D to the patient's health and well-being as well as to their ability to lose weight.  I reviewed possible symptoms of low Vitamin D:  low energy, depressed mood, muscle aches, joint aches, osteoporosis etc. with patient It has been show that administration of vitamin D supplementation leads to improved satiety and a decrease in inflammatory markers.  Hence, low Vitamin D levels may be linked to an increased risk of cardiovascular events and even increased risk of cancers- such as colon and breast. Patient has been instructed to begin ERGO 50K lU once weekly.   TREATMENT  PLAN FOR OBESITY: BMI 38.0-38.48,adult - current BMI 37.56 Class 2 severe obesity due to excess calories with serious comorbidity and body mass index (BMI) of 38.0 to 38.9 in adult (HCC)-starting BMI 03/04/23 38.48 Assessment & Plan: Monica York is here to discuss her progress with her obesity treatment plan along with follow-up of her obesity related diagnoses. See Medical Weight Management Flowsheet for complete bioelectrical impedance results.  Condition is improving. Biometric data collected today, was reviewed with patient.   Since last office visit patient's  Muscle mass has increased by 2 lb. Fat mass has decreased by 8.2 lb. Total body water has decreased by 2.4 lb.  Counseling done on how various foods will affect these numbers and how to maximize success  Total lbs lost to date: 6 lbs  Total weight loss percentage to date: 2.37%  I recommended pt to try the Keto Culture Hamburger Buns and [place]/Black Bean Spaghetti. Pt agrees to continue Category 2 meal plan with B/L options and 6 ounces of lean protein at lunch and 8 ounces at dinner.   Behavioral Intervention Additional resources provided today:  Prediabetes Handout  Healthy Eating Principles Handout Evidence-based interventions for health behavior change were utilized today including the discussion of self monitoring techniques, problem-solving barriers and SMART goal setting techniques.   Regarding patient's less desirable eating habits and patterns, we employed the technique of small changes.  Pt will specifically work on: continuing to increase lean protein intake  for next visit.    Recommended Physical Activity Goals Monica York has been advised to slowly work up to 150 minutes of moderate intensity aerobic activity a week and strengthening exercises 2-3 times per week for cardiovascular health, weight loss maintenance and preservation of muscle mass. She has agreed to Continue current level of physical activity   FOLLOW  UP: Return in about 3 weeks (around 04/09/2023). She was informed of the importance of frequent follow up visits to maximize her success with intensive lifestyle modifications for her multiple health conditions.  Subjective:   Chief complaint: Obesity Monica York is here to discuss her progress with her obesity treatment plan. She is on  the Category 2 Plan and states she is following her eating plan approximately 80 % of the time. She states she is doing cardio 30 minutes 2 days per week.  Interval History:  Monica York is here today for her first follow-up office visit since starting the program with [place].  Since last office visit,  Monica York has been doing well. Endorses having some emotional cravings the first week, ,which subsided the second week. She endorses not weighing her proteins at lunch. Reports eating all the protein for dinner without difficulties. Reports that when eating everything on plan, she was not hungry.   All blood work/ lab tests that were recently ordered by myself or an outside provider  were reviewed with patient today per their request. Extended time was spent counseling her on all new disease processes that were discovered or preexisting ones that are affected by BMI.  she understands that many of these abnormalities will need to monitored regularly along with the current treatment plan of prudent dietary changes, in which we are making each and every office visit, to improve these health parameters.  Review of Systems:  Pertinent positives were addressed with patient today.  Weight Summary and Biometrics   Weight Lost Since Last Visit: 6lb  Weight Gained Since Last Visit: 0lb   Vitals Temp: 97.9 F (36.6 C) BP: 112/79 Pulse Rate: 72 SpO2: 98 %   Anthropometric Measurements Height: 5\' 8"  (1.727 m) Weight: 247 lb (112 kg) BMI (Calculated): 37.56 Weight at Last Visit: 253lb Weight Lost Since Last Visit: 6lb Weight Gained Since Last Visit: 0lb Starting Weight:  253lb Total Weight Loss (lbs): 6 lb (2.722 kg) Peak Weight: 275lb   Body Composition  Body Fat %: 39.7 % Fat Mass (lbs): 98.2 lbs Muscle Mass (lbs): 141.6 lbs Total Body Water (lbs): 90.4 lbs Visceral Fat Rating : 8   Other Clinical Data Fasting: no Labs: no Today's Visit #: 2 Starting Date: 03/04/23   Objective:   PHYSICAL EXAM:  Blood pressure 112/79, pulse 72, temperature 97.9 F (36.6 C), height 5\' 8"  (1.727 m), weight 247 lb (112 kg), SpO2 98%. Body mass index is 37.56 kg/m.  General: Well Developed, well nourished, and in no acute distress.  HEENT: Normocephalic, atraumatic Skin: Warm and dry, cap RF less 2 sec, good turgor Chest:  Normal excursion, shape, no gross abn Respiratory: speaking in full sentences, no conversational dyspnea NeuroM-Sk: Ambulates w/o assistance, moves * 4 Psych: A and O *3, insight good, mood-full  DIAGNOSTIC DATA REVIEWED:  BMET    Component Value Date/Time   NA 140 03/04/2023 1203   K 4.6 03/04/2023 1203   CL 102 03/04/2023 1203   CO2 22 03/04/2023 1203   GLUCOSE 73 03/04/2023 1203   BUN 12 03/04/2023 1203   CREATININE 0.93 03/04/2023 1203   CALCIUM 9.7 03/04/2023 1203   GFRNONAA 101 04/02/2020 1409   GFRAA 117 04/02/2020 1409   Lab Results  Component Value Date   HGBA1C 5.7 (H) 03/04/2023   HGBA1C 5.5 04/02/2020   Lab Results  Component Value Date   INSULIN 6.1 03/04/2023   INSULIN 16.0 04/02/2020   Lab Results  Component Value Date   TSH 1.050 03/04/2023   CBC    Component Value Date/Time   WBC 4.5 03/04/2023 1203   RBC 5.19 03/04/2023 1203   HGB 13.2 03/04/2023 1203   HCT 40.6 03/04/2023 1203   PLT 278 03/04/2023 1203   MCV 78 (L) 03/04/2023 1203   MCH 25.4 (L) 03/04/2023 1203   MCHC 32.5 03/04/2023 1203   RDW 14.4 03/04/2023 1203   Iron Studies    Component Value Date/Time   IRON 56 05/31/2020 1421   TIBC 415 05/31/2020 1421   FERRITIN 34 05/31/2020 1421   IRONPCTSAT 13 (L) 05/31/2020 1421    Lipid Panel     Component Value Date/Time   CHOL 152 03/04/2023 1203   TRIG 59 03/04/2023 1203   HDL 45 03/04/2023 1203   LDLCALC 95 03/04/2023 1203   Hepatic Function Panel     Component Value Date/Time   PROT 7.4 03/04/2023 1203   ALBUMIN 4.6 03/04/2023 1203   AST 20 03/04/2023 1203   ALT 20 03/04/2023 1203  ALKPHOS 93 03/04/2023 1203   BILITOT 0.9 03/04/2023 1203      Component Value Date/Time   TSH 1.050 03/04/2023 1203   Nutritional Lab Results  Component Value Date   VD25OH 32.1 03/04/2023   VD25OH 28.3 (L) 04/02/2020    Attestations:   Reviewed by clinician on day of visit: allergies, medications, problem list, medical history, surgical history, family history, social history, and previous encounter notes.   Patient was in the office today and time spent on visit including pre-visit chart review and post-visit care/coordination of care and electronic medical record documentation was 40 minutes. 50% of the time was in face to face counseling of this patient's medical condition(s) and providing education on treatment options to include the first-line treatment of diet and lifestyle modification.  I, Special Randolm Idol , acting as a Stage manager for [employer], DO., have compiled all relevant documentation for today's office visit on behalf of Thomasene Lot, DO, while in the presence of [employer], DO.  I have reviewed the above documentation for accuracy and completeness, and I agree with the above. Monica York, D.O.  The 21st Century Cures Act was signed into law in 2016 which includes the topic of electronic health records.  This provides immediate access to information in MyChart.  This includes consultation notes, operative notes, office notes, lab results and pathology reports.  If you have any questions about what you read please let us know at your next visit so we can discuss your concerns and take corrective action if need be.  We are right here  with you.

## 2023-04-16 ENCOUNTER — Ambulatory Visit (INDEPENDENT_AMBULATORY_CARE_PROVIDER_SITE_OTHER): Payer: Self-pay | Admitting: Family Medicine

## 2023-04-16 ENCOUNTER — Encounter (INDEPENDENT_AMBULATORY_CARE_PROVIDER_SITE_OTHER): Payer: Self-pay | Admitting: Family Medicine

## 2023-04-16 VITALS — BP 132/82 | HR 79 | Temp 98.2°F | Ht 68.0 in | Wt 242.0 lb

## 2023-04-16 DIAGNOSIS — R7303 Prediabetes: Secondary | ICD-10-CM | POA: Diagnosis not present

## 2023-04-16 DIAGNOSIS — E559 Vitamin D deficiency, unspecified: Secondary | ICD-10-CM | POA: Diagnosis not present

## 2023-04-16 DIAGNOSIS — Z6836 Body mass index (BMI) 36.0-36.9, adult: Secondary | ICD-10-CM

## 2023-04-16 MED ORDER — VITAMIN D (ERGOCALCIFEROL) 1.25 MG (50000 UNIT) PO CAPS
50000.0000 [IU] | ORAL_CAPSULE | ORAL | 0 refills | Status: DC
Start: 2023-04-16 — End: 2023-05-14

## 2023-04-16 NOTE — Progress Notes (Signed)
Monica York, D.O.  ABFM, ABOM Specializing in Clinical Bariatric Medicine  Office located at: [address] [place], [place]  [address]    Assessment and Plan:   Medications Discontinued During This Encounter  Medication Reason   Vitamin D, Ergocalciferol, (DRISDOL) 1.25 MG (50000 UNIT) CAPS capsule Reorder     Meds ordered this encounter  Medications   Vitamin D, Ergocalciferol, (DRISDOL) 1.25 MG (50000 UNIT) CAPS capsule    Sig: Take 1 capsule (50,000 Units total) by mouth every 7 (seven) days.    Dispense:  4 capsule    Refill:  0    Prediabetes Assessment & Plan: Lab Results  Component Value Date   HGBA1C 5.7 (H) 03/04/2023   HGBA1C 5.5 04/02/2020   INSULIN 6.1 03/04/2023   INSULIN 16.0 04/02/2020    Pt feels that her hunger and cravings are pretty well controlled when following her prescribed meal plan. Reminded patient that having protein with each meal is important for stabilizing sugars and an important part of her prediabetes management as well as controlling hunger and cravings.  C/w weight loss efforts/following prescribed meal plan.    Vitamin D deficiency Assessment & Plan: Lab Results  Component Value Date   VD25OH 32.1 03/04/2023   VD25OH 28.3 (L) 04/02/2020   No issues with ERGO 50,000 units once wkly, tolerating well. Continue with their weight loss efforts and ERGO at current dose - Will refill today.     Class 2 severe obesity due to excess calories with serious comorbidity and body mass index (BMI) of 38.0 to 38.9 in adult (HCC)-starting BMI 03/04/23 38.48 Assessment & Plan: Since last office visit on 03/19/23 patient's muscle mass has decreased by 3.6 lb. Fat mass has decreased by 1.2 lb. Total body water has increased by 1.2 lb.  Counseling done on how various foods will affect these numbers and how to maximize success  Total lbs lost to date: 11 lbs  Total weight loss percentage to date: 4.53%   No change to meal plan - see  Subjective  Recommended pt to try the following foods to increase her lean protein intake: Egg whites, Quest chips, Light greek yogurt, [place] packets, etc..   Showed & discussed with pt the ideal Healthy Eating Plate.   Behavioral Intervention Additional resources provided today:  Healthy Eating Principles Handout Evidence-based interventions for health behavior change were utilized today including the discussion of self monitoring techniques, problem-solving barriers and SMART goal setting techniques.   Regarding patient's less desirable eating habits and patterns, we employed the technique of small changes.  Pt will specifically work on: increasing lean protein intake for next visit.    FOLLOW UP: Return in about 4 weeks (around 05/14/2023). She was informed of the importance of frequent follow up visits to maximize her success with intensive lifestyle modifications for her multiple health conditions.  Subjective:   Chief complaint: Obesity Monica York is here to discuss her progress with her obesity treatment plan. She is on the Category 2 Plan with B/L options and 6 ounces of lean protein at lunch and 8 ounces at dinner and states she is following her eating plan approximately 80% of the time. She states she is doing cardio 30-45 minutes 3 days per week and some weight training.   Interval History:  Monica York is here for a follow up office visit. Since last OV, Monica York has been doing well. She is working towards drinking 1 gallon of water daily. On most days, she  consumes 4 ounces of lean protein at lunch & 8 ounces at dinner. She ordinarily uses her snack calories on Premier Protein shakes or  Quest protein bars. Endorses that hunger and cravings are pretty well controlled.   Review of Systems:  Pertinent positives were addressed with patient today.  Reviewed by clinician on day of visit: allergies, medications, problem list, medical history, surgical history, family history, social history,  and previous encounter notes.  Weight Summary and Biometrics   Weight Lost Since Last Visit: 5lb  Weight Gained Since Last Visit: 0lb   Vitals Temp: 98.2 F (36.8 C) BP: 132/82 Pulse Rate: 79 SpO2: 98 %   Anthropometric Measurements Height: 5\' 8"  (1.727 m) Weight: 242 lb (109.8 kg) BMI (Calculated): 36.8 Weight at Last Visit: 247lb Weight Lost Since Last Visit: 5lb Weight Gained Since Last Visit: 0lb Starting Weight: 253lb Total Weight Loss (lbs): 11 lb (4.99 kg) Peak Weight: 275lb   No data recorded Other Clinical Data Fasting: no Labs: no Today's Visit #: 3 Starting Date: 03/04/23   Objective:   PHYSICAL EXAM: Blood pressure 132/82, pulse 79, temperature 98.2 F (36.8 C), height 5\' 8"  (1.727 m), weight 242 lb (109.8 kg), SpO2 98%. Body mass index is 36.8 kg/m.  General: Well Developed, well nourished, and in no acute distress.  HEENT: Normocephalic, atraumatic Skin: Warm and dry, cap RF less 2 sec, good turgor Chest:  Normal excursion, shape, no gross abn Respiratory: speaking in full sentences, no conversational dyspnea NeuroM-Sk: Ambulates w/o assistance, moves * 4 Psych: A and O *3, insight good, mood-full  DIAGNOSTIC DATA REVIEWED:  BMET    Component Value Date/Time   NA 140 03/04/2023 1203   K 4.6 03/04/2023 1203   CL 102 03/04/2023 1203   CO2 22 03/04/2023 1203   GLUCOSE 73 03/04/2023 1203   BUN 12 03/04/2023 1203   CREATININE 0.93 03/04/2023 1203   CALCIUM 9.7 03/04/2023 1203   GFRNONAA 101 04/02/2020 1409   GFRAA 117 04/02/2020 1409   Lab Results  Component Value Date   HGBA1C 5.7 (H) 03/04/2023   HGBA1C 5.5 04/02/2020   Lab Results  Component Value Date   INSULIN 6.1 03/04/2023   INSULIN 16.0 04/02/2020   Lab Results  Component Value Date   TSH 1.050 03/04/2023   CBC    Component Value Date/Time   WBC 4.5 03/04/2023 1203   RBC 5.19 03/04/2023 1203   HGB 13.2 03/04/2023 1203   HCT 40.6 03/04/2023 1203   PLT 278  03/04/2023 1203   MCV 78 (L) 03/04/2023 1203   MCH 25.4 (L) 03/04/2023 1203   MCHC 32.5 03/04/2023 1203   RDW 14.4 03/04/2023 1203   Iron Studies    Component Value Date/Time   IRON 56 05/31/2020 1421   TIBC 415 05/31/2020 1421   FERRITIN 34 05/31/2020 1421   IRONPCTSAT 13 (L) 05/31/2020 1421   Lipid Panel     Component Value Date/Time   CHOL 152 03/04/2023 1203   TRIG 59 03/04/2023 1203   HDL 45 03/04/2023 1203   LDLCALC 95 03/04/2023 1203   Hepatic Function Panel     Component Value Date/Time   PROT 7.4 03/04/2023 1203   ALBUMIN 4.6 03/04/2023 1203   AST 20 03/04/2023 1203   ALT 20 03/04/2023 1203   ALKPHOS 93 03/04/2023 1203   BILITOT 0.9 03/04/2023 1203      Component Value Date/Time   TSH 1.050 03/04/2023 1203   Nutritional Lab Results  Component Value Date  VD25OH 32.1 03/04/2023   VD25OH 28.3 (L) 04/02/2020    Attestations:   I, Special Puri, acting as a Stage manager for Thomasene Lot, DO., have compiled all relevant documentation for today's office visit on behalf of Thomasene Lot, DO, while in the presence of [employer], DO.  I have reviewed the above documentation for accuracy and completeness, and I agree with the above. Monica York, D.O.  The 21st Century Cures Act was signed into law in 2016 which includes the topic of electronic health records.  This provides immediate access to information in MyChart.  This includes consultation notes, operative notes, office notes, lab results and pathology reports.  If you have any questions about what you read please let us know at your next visit so we can discuss your concerns and take corrective action if need be.  We are right here with you.

## 2023-05-14 ENCOUNTER — Encounter (INDEPENDENT_AMBULATORY_CARE_PROVIDER_SITE_OTHER): Payer: Self-pay | Admitting: Family Medicine

## 2023-05-14 ENCOUNTER — Ambulatory Visit (INDEPENDENT_AMBULATORY_CARE_PROVIDER_SITE_OTHER): Payer: Self-pay | Admitting: Family Medicine

## 2023-05-14 VITALS — BP 130/81 | HR 70 | Temp 98.7°F | Ht 68.0 in | Wt 236.0 lb

## 2023-05-14 DIAGNOSIS — E559 Vitamin D deficiency, unspecified: Secondary | ICD-10-CM

## 2023-05-14 DIAGNOSIS — Z6838 Body mass index (BMI) 38.0-38.9, adult: Secondary | ICD-10-CM

## 2023-05-14 DIAGNOSIS — R7303 Prediabetes: Secondary | ICD-10-CM

## 2023-05-14 MED ORDER — VITAMIN D (ERGOCALCIFEROL) 1.25 MG (50000 UNIT) PO CAPS
50000.0000 [IU] | ORAL_CAPSULE | ORAL | 0 refills | Status: DC
Start: 2023-05-14 — End: 2023-06-15

## 2023-05-14 NOTE — Progress Notes (Signed)
Monica York, D.O.  ABFM, ABOM Specializing in Clinical Bariatric Medicine  Office located at: [address] [place], [place]  [address]     Assessment and Plan:   Medications Discontinued During This Encounter  Medication Reason   Vitamin D, Ergocalciferol, (DRISDOL) 1.25 MG (50000 UNIT) CAPS capsule Reorder     Meds ordered this encounter  Medications   Vitamin D, Ergocalciferol, (DRISDOL) 1.25 MG (50000 UNIT) CAPS capsule    Sig: Take 1 capsule (50,000 Units total) by mouth every 7 (seven) days.    Dispense:  4 capsule    Refill:  0     Prediabetes Assessment & Plan: Lab Results  Component Value Date   HGBA1C 5.7 (H) 03/04/2023   HGBA1C 5.5 04/02/2020   INSULIN 6.1 03/04/2023   INSULIN 16.0 04/02/2020    No current meds. Diet/exercise. Hunger controlled. No issues related to carb cravings. Continue with weight loss therapy.     Vitamin D deficiency Assessment & Plan: Lab Results  Component Value Date   VD25OH 32.1 03/04/2023   VD25OH 28.3 (L) 04/02/2020   Condition treated with ERGO 50,000 units once a wk. C/w their weight loss efforts and high-dose vitamin D - Will refill ERGO today.      Class 2 severe obesity due to excess calories with serious comorbidity and body mass index (BMI) of 38.0 to 38.9 in adult (HCC)-starting BMI 03/04/23 38.48 Assessment & Plan: Since last office visit on 03/19/23 patient's muscle mass has decreased by 5 lb. Fat mass has decreased by 5.6 lb. Total body water has decreased by 1 lb.  Counseling done on how various foods will affect these numbers and how to maximize success  Total lbs lost to date: 17 lbs  Total weight loss percentage to date: 6.72%  No change to meal plan - see Subjective   Behavioral Intervention Additional resources provided today: NA  Evidence-based interventions for health behavior change were utilized today including the discussion of self monitoring techniques, problem-solving barriers and SMART  goal setting techniques.   Regarding patient's less desirable eating habits and patterns, we employed the technique of small changes.  Pt will specifically work on: having a protein shake (e.g fairlife) after her workouts for next visit.    FOLLOW UP: Return in 3 wks. She was informed of the importance of frequent follow up visits to maximize her success with intensive lifestyle modifications for her multiple health conditions.  Subjective:   Chief complaint: Obesity Monica York is here to discuss her progress with her obesity treatment plan. She is on the Category 2 Plan with B/L options and 6 ounces of lean protein at lunch and 8 ounces at dinner and states she is following her eating plan approximately 85% of the time. She states she is doing cardio/weights 45-60 minutes 3-4 days per week.  Interval History:  Monica York is here for a follow up office visit. Since last OV, Monica York has been doing well. Has been working on increasing her protein through foods like egg whites. Reports having 6 ounces of lean protein for lunch & 8 ounces for dinner. Went out to eat twice this past month. Does not use her snack calories. Drinks roughly 1 gallon of water daily. Notes that her clothes feel looser.   Review of Systems:  Pertinent positives were addressed with patient today.  Reviewed by clinician on day of visit: allergies, medications, problem list, medical history, surgical history, family history, social history, and previous encounter notes.  Weight Summary and Biometrics   Weight Lost Since Last Visit: 6lb  Weight Gained Since Last Visit: 0lb   Vitals Temp: 98.7 F (37.1 C) BP: 130/81 Pulse Rate: 70 SpO2: 98 %   Anthropometric Measurements Height: 5\' 8"  (1.727 m) Weight: 236 lb (107 kg) BMI (Calculated): 35.89 Weight at Last Visit: 242lb Weight Lost Since Last Visit: 6lb Weight Gained Since Last Visit: 0lb Starting Weight: 253lb Total Weight Loss (lbs): 17 lb (7.711 kg) Peak  Weight: 275lb   Body Composition  Body Fat %: 39.2 % Fat Mass (lbs): 92.6 lbs Muscle Mass (lbs): 136.6 lbs Total Body Water (lbs): 89.4 lbs Visceral Fat Rating : 8   Other Clinical Data Fasting: no Labs: no Today's Visit #: 4 Starting Date: 03/04/23   Objective:   PHYSICAL EXAM: Blood pressure 130/81, pulse 70, temperature 98.7 F (37.1 C), height 5\' 8"  (1.727 m), weight 236 lb (107 kg), SpO2 98%. Body mass index is 35.88 kg/m.  General: Well Developed, well nourished, and in no acute distress.  HEENT: Normocephalic, atraumatic Skin: Warm and dry, cap RF less 2 sec, good turgor Chest:  Normal excursion, shape, no gross abn Respiratory: speaking in full sentences, no conversational dyspnea NeuroM-Sk: Ambulates w/o assistance, moves * 4 Psych: A and O *3, insight good, mood-full  DIAGNOSTIC DATA REVIEWED:  BMET    Component Value Date/Time   NA 140 03/04/2023 1203   K 4.6 03/04/2023 1203   CL 102 03/04/2023 1203   CO2 22 03/04/2023 1203   GLUCOSE 73 03/04/2023 1203   BUN 12 03/04/2023 1203   CREATININE 0.93 03/04/2023 1203   CALCIUM 9.7 03/04/2023 1203   GFRNONAA 101 04/02/2020 1409   GFRAA 117 04/02/2020 1409   Lab Results  Component Value Date   HGBA1C 5.7 (H) 03/04/2023   HGBA1C 5.5 04/02/2020   Lab Results  Component Value Date   INSULIN 6.1 03/04/2023   INSULIN 16.0 04/02/2020   Lab Results  Component Value Date   TSH 1.050 03/04/2023   CBC    Component Value Date/Time   WBC 4.5 03/04/2023 1203   RBC 5.19 03/04/2023 1203   HGB 13.2 03/04/2023 1203   HCT 40.6 03/04/2023 1203   PLT 278 03/04/2023 1203   MCV 78 (L) 03/04/2023 1203   MCH 25.4 (L) 03/04/2023 1203   MCHC 32.5 03/04/2023 1203   RDW 14.4 03/04/2023 1203   Iron Studies    Component Value Date/Time   IRON 56 05/31/2020 1421   TIBC 415 05/31/2020 1421   FERRITIN 34 05/31/2020 1421   IRONPCTSAT 13 (L) 05/31/2020 1421   Lipid Panel     Component Value Date/Time   CHOL 152  03/04/2023 1203   TRIG 59 03/04/2023 1203   HDL 45 03/04/2023 1203   LDLCALC 95 03/04/2023 1203   Hepatic Function Panel     Component Value Date/Time   PROT 7.4 03/04/2023 1203   ALBUMIN 4.6 03/04/2023 1203   AST 20 03/04/2023 1203   ALT 20 03/04/2023 1203   ALKPHOS 93 03/04/2023 1203   BILITOT 0.9 03/04/2023 1203      Component Value Date/Time   TSH 1.050 03/04/2023 1203   Nutritional Lab Results  Component Value Date   VD25OH 32.1 03/04/2023   VD25OH 28.3 (L) 04/02/2020    Attestations:   I, Special Puri, acting as a Stage manager for Thomasene Lot, DO., have compiled all relevant documentation for today's office visit on behalf of Thomasene Lot, DO, while in the presence  of [employer], DO.  I have reviewed the above documentation for accuracy and completeness, and I agree with the above. Monica York, D.O.  The 21st Century Cures Act was signed into law in 2016 which includes the topic of electronic health records.  This provides immediate access to information in MyChart.  This includes consultation notes, operative notes, office notes, lab results and pathology reports.  If you have any questions about what you read please let us know at your next visit so we can discuss your concerns and take corrective action if need be.  We are right here with you.

## 2023-05-21 ENCOUNTER — Ambulatory Visit (INDEPENDENT_AMBULATORY_CARE_PROVIDER_SITE_OTHER): Payer: Self-pay | Admitting: Family Medicine

## 2023-06-11 ENCOUNTER — Ambulatory Visit (INDEPENDENT_AMBULATORY_CARE_PROVIDER_SITE_OTHER): Payer: Self-pay | Admitting: Family Medicine

## 2023-06-15 ENCOUNTER — Encounter (INDEPENDENT_AMBULATORY_CARE_PROVIDER_SITE_OTHER): Payer: Self-pay | Admitting: Family Medicine

## 2023-06-15 ENCOUNTER — Ambulatory Visit (INDEPENDENT_AMBULATORY_CARE_PROVIDER_SITE_OTHER): Payer: Self-pay | Admitting: Family Medicine

## 2023-06-15 VITALS — BP 130/84 | HR 77 | Temp 98.7°F | Ht 68.0 in | Wt 233.0 lb

## 2023-06-15 DIAGNOSIS — F5089 Other specified eating disorder: Secondary | ICD-10-CM

## 2023-06-15 DIAGNOSIS — R7303 Prediabetes: Secondary | ICD-10-CM | POA: Insufficient documentation

## 2023-06-15 DIAGNOSIS — F509 Eating disorder, unspecified: Secondary | ICD-10-CM | POA: Insufficient documentation

## 2023-06-15 DIAGNOSIS — E66812 Obesity, class 2: Secondary | ICD-10-CM

## 2023-06-15 DIAGNOSIS — Z6838 Body mass index (BMI) 38.0-38.9, adult: Secondary | ICD-10-CM

## 2023-06-15 DIAGNOSIS — E559 Vitamin D deficiency, unspecified: Secondary | ICD-10-CM

## 2023-06-15 MED ORDER — VITAMIN D (ERGOCALCIFEROL) 1.25 MG (50000 UNIT) PO CAPS
50000.0000 [IU] | ORAL_CAPSULE | ORAL | 0 refills | Status: DC
Start: 2023-06-15 — End: 2023-07-28

## 2023-06-15 NOTE — Progress Notes (Signed)
Monica York, D.O.  ABFM, ABOM Specializing in Clinical Bariatric Medicine  Office located at: [address] [place], [place]  [address]     Assessment and Plan:  Draw labs next OV.  Medications Discontinued During This Encounter  Medication Reason   Vitamin D, Ergocalciferol, (DRISDOL) 1.25 MG (50000 UNIT) CAPS capsule Reorder    Meds ordered this encounter  Medications   Vitamin D, Ergocalciferol, (DRISDOL) 1.25 MG (50000 UNIT) CAPS capsule    Sig: Take 1 capsule (50,000 Units total) by mouth every 7 (seven) days.    Dispense:  4 capsule    Refill:  0   Other disorder of eating-emotional eating Assessment: Condition is Controlled.. This is diet/exercise controlled. She endorses that her emotional eating is under control.  Plan: - Continue to follow our prudent nutritional meal plan.   - Continue to make health conscious decisions.    Vitamin D deficiency Assessment: Condition is Not at goal.. Patient reports good compliance and tolerance of ERGO once weekly for the past 3 months. She denies any adverse effects on this medication.  Lab Results  Component Value Date   VD25OH 32.1 03/04/2023   VD25OH 28.3 (L) 04/02/2020   Plan: - Continue with Ergocalciferol 50K IU weekly. I will refill.    - weight loss will likely improve availability of vitamin D, thus encouraged Monica York to continue with meal plan and their weight loss efforts to further improve this condition.  Thus, we will need to monitor levels regularly (every 3-4 mo on average) to keep levels within normal limits and prevent over supplementation.   Prediabetes Assessment: Condition is Not at goal.. This is diet/exercise controlled. She endorses her hunger and cravings are well controlled, especially when she consumes more protein.  Lab Results  Component Value Date   HGBA1C 5.7 (H) 03/04/2023   HGBA1C 5.5 04/02/2020   INSULIN 6.1 03/04/2023   INSULIN 16.0 04/02/2020     Plan:- Pt denies wanting to  re-start Metformin.    - Continue to decrease simple carbs/ sugars; increase fiber and proteins -> follow her meal plan.    - Anticipatory guidance given.    - Monica York will continue to work on weight loss, exercise, via their meal plan we devised to help decrease the risk of progressing to diabetes.   - We will recheck A1c and fasting insulin level in approximately 3 months from last check, or as deemed appropriate.    TREATMENT PLAN FOR OBESITY: BMI 38.0-38.48,adult - current BMI 35.44 Class 2 severe obesity due to excess calories with serious comorbidity and body mass index (BMI) of 38.0 to 38.9 in adult (HCC)-starting BMI 03/04/23 38.48 Assessment:  Monica York is here to discuss her progress with her obesity treatment plan along with follow-up of her obesity related diagnoses. See Medical Weight Management Flowsheet for complete bioelectrical impedance results.  Condition is improving. Biometric data collected today, was reviewed with patient.   Since last office visit on 05/14/2023 patient's  Muscle mass has increased by 0.6lb. Fat mass has decreased by 3.2lb. Total body water has decreased by 2.2lb.  Counseling done on how various foods will affect these numbers and how to maximize success  Total lbs lost to date: 20 Total weight loss percentage to date: 7.91%  Plan: - Continue to adhere the Category 2 Plan with B/L options and 6 ounces of lean protein at lunch and 8 ounces at dinner.   Behavioral Intervention Additional resources provided today: patient declined Evidence-based interventions for  health behavior change were utilized today including the discussion of self monitoring techniques, problem-solving barriers and SMART goal setting techniques.   Regarding patient's less desirable eating habits and patterns, we employed the technique of small changes.  Pt will specifically work on: Food prep and planning for next visit.    She has agreed to Continue current level of  physical activity    FOLLOW UP: Return in about 4 weeks (around 07/13/2023).  She was informed of the importance of frequent follow up visits to maximize her success with intensive lifestyle modifications for her multiple health conditions.  Subjective:   Chief complaint: Obesity Monica York is here to discuss her progress with her obesity treatment plan. She is on the Category 2 Plan with B/L options and 6 ounces of lean protein at lunch and 8 ounces at dinner  and states she is following her eating plan approximately 60% of the time. She states she is doing cardio and weight lifting 45-60 minutes 3 days per week.  Interval History:  Monica York is here for a follow up office visit.     Since last office visit:  She states that this month has been harder for her to follow the meal plan She informed me that she has been traveling and did not prepare for how she would eat when traveling. She notes needing to meal prep more especially when she travels. She has been more health conscious when making meal decisions and has increased her protein intake.   We reviewed her meal plan and all questions were answered.   Review of Systems:  Pertinent positives were addressed with patient today.  Reviewed by clinician on day of visit: allergies, medications, problem list, medical history, surgical history, family history, social history, and previous encounter notes.  Weight Summary and Biometrics   Weight Lost Since Last Visit: 3lb  Weight Gained Since Last Visit: 0   Vitals Temp: 98.7 F (37.1 C) BP: 130/84 Pulse Rate: 77 SpO2: 99 %   Anthropometric Measurements Height: 5\' 8"  (1.727 m) Weight: 233 lb (105.7 kg) BMI (Calculated): 35.44 Weight at Last Visit: 236lb Weight Lost Since Last Visit: 3lb Weight Gained Since Last Visit: 0 Starting Weight: 253lb Total Weight Loss (lbs): 20 lb (9.072 kg) Peak Weight: 275lb   Body Composition  Body Fat %: 38.2 % Fat Mass (lbs): 89.4  lbs Muscle Mass (lbs): 137.2 lbs Total Body Water (lbs): 87.2 lbs Visceral Fat Rating : 7   Other Clinical Data Fasting: no Labs: no Today's Visit #: 5 Starting Date: 03/04/23     Objective:   PHYSICAL EXAM: Blood pressure 130/84, pulse 77, temperature 98.7 F (37.1 C), height 5\' 8"  (1.727 m), weight 233 lb (105.7 kg), SpO2 99%. Body mass index is 35.43 kg/m.  General: Well Developed, well nourished, and in no acute distress.  HEENT: Normocephalic, atraumatic Skin: Warm and dry, cap RF less 2 sec, good turgor Chest:  Normal excursion, shape, no gross abn Respiratory: speaking in full sentences, no conversational dyspnea NeuroM-Sk: Ambulates w/o assistance, moves * 4 Psych: A and O *3, insight good, mood-full  DIAGNOSTIC DATA REVIEWED:  BMET    Component Value Date/Time   NA 140 03/04/2023 1203   K 4.6 03/04/2023 1203   CL 102 03/04/2023 1203   CO2 22 03/04/2023 1203   GLUCOSE 73 03/04/2023 1203   BUN 12 03/04/2023 1203   CREATININE 0.93 03/04/2023 1203   CALCIUM 9.7 03/04/2023 1203   GFRNONAA 101 04/02/2020 1409   GFRAA  117 04/02/2020 1409   Lab Results  Component Value Date   HGBA1C 5.7 (H) 03/04/2023   HGBA1C 5.5 04/02/2020   Lab Results  Component Value Date   INSULIN 6.1 03/04/2023   INSULIN 16.0 04/02/2020   Lab Results  Component Value Date   TSH 1.050 03/04/2023   CBC    Component Value Date/Time   WBC 4.5 03/04/2023 1203   RBC 5.19 03/04/2023 1203   HGB 13.2 03/04/2023 1203   HCT 40.6 03/04/2023 1203   PLT 278 03/04/2023 1203   MCV 78 (L) 03/04/2023 1203   MCH 25.4 (L) 03/04/2023 1203   MCHC 32.5 03/04/2023 1203   RDW 14.4 03/04/2023 1203   Iron Studies    Component Value Date/Time   IRON 56 05/31/2020 1421   TIBC 415 05/31/2020 1421   FERRITIN 34 05/31/2020 1421   IRONPCTSAT 13 (L) 05/31/2020 1421   Lipid Panel     Component Value Date/Time   CHOL 152 03/04/2023 1203   TRIG 59 03/04/2023 1203   HDL 45 03/04/2023 1203    LDLCALC 95 03/04/2023 1203   Hepatic Function Panel     Component Value Date/Time   PROT 7.4 03/04/2023 1203   ALBUMIN 4.6 03/04/2023 1203   AST 20 03/04/2023 1203   ALT 20 03/04/2023 1203   ALKPHOS 93 03/04/2023 1203   BILITOT 0.9 03/04/2023 1203      Component Value Date/Time   TSH 1.050 03/04/2023 1203   Nutritional Lab Results  Component Value Date   VD25OH 32.1 03/04/2023   VD25OH 28.3 (L) 04/02/2020    Attestations:   I, Clinical biochemist, acting as a Stage manager for [employer], DO., have compiled all relevant documentation for today's office visit on behalf of Thomasene Lot, DO, while in the presence of [employer], DO.  I have reviewed the above documentation for accuracy and completeness, and I agree with the above. Monica York, D.O.  The 21st Century Cures Act was signed into law in 2016 which includes the topic of electronic health records.  This provides immediate access to information in MyChart.  This includes consultation notes, operative notes, office notes, lab results and pathology reports.  If you have any questions about what you read please let us know at your next visit so we can discuss your concerns and take corrective action if need be.  We are right here with you.

## 2023-07-09 ENCOUNTER — Ambulatory Visit (INDEPENDENT_AMBULATORY_CARE_PROVIDER_SITE_OTHER): Payer: Self-pay | Admitting: Family Medicine

## 2023-07-28 ENCOUNTER — Ambulatory Visit (INDEPENDENT_AMBULATORY_CARE_PROVIDER_SITE_OTHER): Payer: Self-pay | Admitting: Family Medicine

## 2023-07-28 ENCOUNTER — Encounter (INDEPENDENT_AMBULATORY_CARE_PROVIDER_SITE_OTHER): Payer: Self-pay | Admitting: Family Medicine

## 2023-07-28 VITALS — BP 122/77 | HR 98 | Temp 98.3°F | Ht 68.0 in | Wt 230.0 lb

## 2023-07-28 DIAGNOSIS — E559 Vitamin D deficiency, unspecified: Secondary | ICD-10-CM

## 2023-07-28 DIAGNOSIS — R7303 Prediabetes: Secondary | ICD-10-CM

## 2023-07-28 DIAGNOSIS — Z6834 Body mass index (BMI) 34.0-34.9, adult: Secondary | ICD-10-CM

## 2023-07-28 DIAGNOSIS — E66812 Obesity, class 2: Secondary | ICD-10-CM

## 2023-07-28 MED ORDER — VITAMIN D (ERGOCALCIFEROL) 1.25 MG (50000 UNIT) PO CAPS: 50000.0000 [IU] | ORAL_CAPSULE | ORAL | 0 refills | Status: DC

## 2023-07-28 NOTE — Progress Notes (Incomplete)
Monica York, D.O.  ABFM, ABOM Specializing in Clinical Bariatric Medicine  Office located at: [address] [place], [place]  [address]   Assessment and Plan:   FOR THE DISEASE OF OBESITY: BMI 38.0-38.48,adult - current BMI 35.44 Class 2 severe obesity due to excess calories with serious comorbidity and body mass index (BMI) of 38.0 to 38.9 in adult (HCC)-starting BMI 03/04/23 38.48 Assessment & Plan: Since last office visit on 06/15/23 patient's  muscle mass has decreased by 1.8 lb. Fat mass has decreased by 1.6 lb. Total body water has not changed. Counseling done on how various foods will affect these numbers and how to maximize success  Total lbs lost to date: 23 lbs  Total weight loss percentage to date: 9.09%    Recommended Dietary Goals Monica York is currently in the action stage of change. As such, her goal is to continue weight management plan.  She has agreed to: continue current plan   Behavioral Intervention We discussed the following today: increasing protein intake to established goals through foods like lentils, Edamame beans, low fat cheeses, egg whites, tuna pouches, etc...  Additional resources provided today: handout on protein content of various foods and Handout on Healthy Eating Principles  Evidence-based interventions for health behavior change were utilized today including the discussion of self monitoring techniques, problem-solving barriers and SMART goal setting techniques.   Regarding patient's less desirable eating habits and patterns, we employed the technique of small changes.   Pt will specifically work on: focusing on alternative protein options apart from lean meat.    Recommended Physical Activity Goals Monica York has been advised to work up to 150 minutes of moderate intensity aerobic activity a week and strengthening exercises 2-3 times per week for cardiovascular health, weight loss maintenance and preservation of muscle mass.   She has  agreed to : Continue current level of physical activity    Pharmacotherapy We both agreed to : continue with nutritional and behavioral strategies   FOR ASSOCIATED CONDITIONS ADDRESSED TODAY:  Vitamin D deficiency Assessment & Plan: Most recent vitamin D:  Lab Results  Component Value Date   VD25OH 32.1 03/04/2023   VD25OH 28.3 (L) 04/02/2020   She is currently on ERGO 50,000 units once a week without any adverse SE. She will continue with weight loss efforts and rx high dose vitamin D.   Orders: -     Vitamin D (Ergocalciferol); Take 1 capsule (50,000 Units total) by mouth every 7 (seven) days.  Dispense: 4 capsule; Refill: 0 -     VITAMIN D 25 Hydroxy (Vit-D Deficiency, Fractures)   Prediabetes Assessment & Plan: Most recent Hbg A1c and fasting insulin: Lab Results  Component Value Date   HGBA1C 5.7 (H) 03/04/2023   HGBA1C 5.5 04/02/2020   INSULIN 6.1 03/04/2023   INSULIN 16.0 04/02/2020    No current meds. Diet/exercise approach. Her Hunger and cravings are relatively controlled when following her meal plan. She will continue with weight loss therapy via her [facility].   Orders: -     Hemoglobin A1c   Follow up:   Return 09/01/2022. She was informed of the importance of frequent follow up visits to maximize her success with intensive lifestyle modifications for her multiple health conditions.  Subjective:   Chief complaint: Obesity Monica York is here to discuss her progress with her obesity treatment plan. She is on the Category 2 Plan with B/L options and 6 ounces of lean protein at lunch and 8 ounces at dinner  and states she is following her eating plan approximately 70% of the time. She states she is doing cardio 20-30 minutes, 3-4 days a week and weight training 20-30 minutes, 3-4 days a week.   Interval History:  Monica York is here for a follow up office visit. Since last OV, Monica York is down 3 lbs. She admits that it is difficult to get in all the lean protein on  her plan. She inquires about additional protein options besides meat. Hunger and cravings are relatively well controlled.  Pharmacotherapy for weight loss: She is currently taking no anti-obesity medication.   Review of Systems:  Pertinent positives were addressed with patient today.  Reviewed by clinician on day of visit: allergies, medications, problem list, medical history, surgical history, family history, social history, and previous encounter notes.  Weight Summary and Biometrics   Weight Lost Since Last Visit: 3lb  Weight Gained Since Last Visit: 0lb  Vitals Temp: 98.3 F (36.8 C) BP: 122/77 Pulse Rate: 98 SpO2: 100 %   Anthropometric Measurements Height: 5\' 8"  (1.727 m) Weight: 230 lb (104.3 kg) BMI (Calculated): 34.98 Weight at Last Visit: 233lb Weight Lost Since Last Visit: 3lb Weight Gained Since Last Visit: 0lb Starting Weight: 253lb Total Weight Loss (lbs): 23 lb (10.4 kg) Peak Weight: 275lb   Body Composition  Body Fat %: 38.1 % Fat Mass (lbs): 87.8 lbs Muscle Mass (lbs): 135.4 lbs Total Body Water (lbs): 87.2 lbs Visceral Fat Rating : 7   Other Clinical Data Fasting: no Labs: no Today's Visit #: 6 Starting Date: 03/04/23   Objective:   PHYSICAL EXAM: Blood pressure 122/77, pulse 98, temperature 98.3 F (36.8 C), height 5\' 8"  (1.727 m), weight 230 lb (104.3 kg), SpO2 100%. Body mass index is 34.97 kg/m.  General: she is overweight, cooperative and in no acute distress. PSYCH: Has normal mood, affect and thought process.   HEENT: EOMI, sclerae are anicteric. Lungs: Normal breathing effort, no conversational dyspnea. Extremities: Moves * 4 Neurologic: A and O * 3, good insight  DIAGNOSTIC DATA REVIEWED: BMET    Component Value Date/Time   NA 140 03/04/2023 1203   K 4.6 03/04/2023 1203   CL 102 03/04/2023 1203   CO2 22 03/04/2023 1203   GLUCOSE 73 03/04/2023 1203   BUN 12 03/04/2023 1203   CREATININE 0.93 03/04/2023 1203   CALCIUM  9.7 03/04/2023 1203   GFRNONAA 101 04/02/2020 1409   GFRAA 117 04/02/2020 1409   Lab Results  Component Value Date   HGBA1C 5.7 (H) 03/04/2023   HGBA1C 5.5 04/02/2020   Lab Results  Component Value Date   INSULIN 6.1 03/04/2023   INSULIN 16.0 04/02/2020   Lab Results  Component Value Date   TSH 1.050 03/04/2023   CBC    Component Value Date/Time   WBC 4.5 03/04/2023 1203   RBC 5.19 03/04/2023 1203   HGB 13.2 03/04/2023 1203   HCT 40.6 03/04/2023 1203   PLT 278 03/04/2023 1203   MCV 78 (L) 03/04/2023 1203   MCH 25.4 (L) 03/04/2023 1203   MCHC 32.5 03/04/2023 1203   RDW 14.4 03/04/2023 1203   Iron Studies    Component Value Date/Time   IRON 56 05/31/2020 1421   TIBC 415 05/31/2020 1421   FERRITIN 34 05/31/2020 1421   IRONPCTSAT 13 (L) 05/31/2020 1421   Lipid Panel     Component Value Date/Time   CHOL 152 03/04/2023 1203   TRIG 59 03/04/2023 1203   HDL 45 03/04/2023 1203  LDLCALC 95 03/04/2023 1203   Hepatic Function Panel     Component Value Date/Time   PROT 7.4 03/04/2023 1203   ALBUMIN 4.6 03/04/2023 1203   AST 20 03/04/2023 1203   ALT 20 03/04/2023 1203   ALKPHOS 93 03/04/2023 1203   BILITOT 0.9 03/04/2023 1203      Component Value Date/Time   TSH 1.050 03/04/2023 1203   Nutritional Lab Results  Component Value Date   VD25OH 32.1 03/04/2023   VD25OH 28.3 (L) 04/02/2020    Attestations:   I, Special Puri, acting as a Stage manager for Monica Lot, DO., have compiled all relevant documentation for today's office visit on behalf of Monica Lot, DO, while in the presence of [employer], DO.  I have reviewed the above documentation for accuracy and completeness, and I agree with the above. Monica York, D.O.  The 21st Century Cures Act was signed into law in 2016 which includes the topic of electronic health records.  This provides immediate access to information in MyChart.  This includes consultation notes, operative notes,  office notes, lab results and pathology reports.  If you have any questions about what you read please let us know at your next visit so we can discuss your concerns and take corrective action if need be.  We are right here with you.

## 2023-07-29 LAB — HEMOGLOBIN A1C
Est. average glucose Bld gHb Est-mCnc: 108 mg/dL
Hgb A1c MFr Bld: 5.4 % (ref 4.8–5.6)

## 2023-07-29 LAB — VITAMIN D 25 HYDROXY (VIT D DEFICIENCY, FRACTURES): Vit D, 25-Hydroxy: 57.5 ng/mL (ref 30.0–100.0)

## 2023-08-06 ENCOUNTER — Ambulatory Visit (INDEPENDENT_AMBULATORY_CARE_PROVIDER_SITE_OTHER): Payer: Self-pay | Admitting: Family Medicine

## 2023-09-02 ENCOUNTER — Ambulatory Visit (INDEPENDENT_AMBULATORY_CARE_PROVIDER_SITE_OTHER): Payer: Self-pay | Admitting: Family Medicine

## 2023-09-21 ENCOUNTER — Ambulatory Visit (INDEPENDENT_AMBULATORY_CARE_PROVIDER_SITE_OTHER): Payer: Self-pay | Admitting: Family Medicine

## 2023-10-21 ENCOUNTER — Encounter (INDEPENDENT_AMBULATORY_CARE_PROVIDER_SITE_OTHER): Payer: Self-pay | Admitting: Family Medicine

## 2023-10-21 ENCOUNTER — Ambulatory Visit (INDEPENDENT_AMBULATORY_CARE_PROVIDER_SITE_OTHER): Payer: Self-pay | Admitting: Family Medicine

## 2023-10-21 VITALS — BP 124/81 | HR 68 | Temp 98.6°F | Ht 68.0 in | Wt 234.0 lb

## 2023-10-21 DIAGNOSIS — E66812 Obesity, class 2: Secondary | ICD-10-CM

## 2023-10-21 DIAGNOSIS — Z6835 Body mass index (BMI) 35.0-35.9, adult: Secondary | ICD-10-CM

## 2023-10-21 DIAGNOSIS — E559 Vitamin D deficiency, unspecified: Secondary | ICD-10-CM

## 2023-10-21 DIAGNOSIS — R7303 Prediabetes: Secondary | ICD-10-CM

## 2023-10-21 MED ORDER — VITAMIN D (ERGOCALCIFEROL) 1.25 MG (50000 UNIT) PO CAPS
50000.0000 [IU] | ORAL_CAPSULE | ORAL | 1 refills | Status: DC
Start: 2023-10-21 — End: 2023-11-26

## 2023-10-21 NOTE — Progress Notes (Signed)
 Monica York, D.O.  ABFM, ABOM Specializing in Clinical Bariatric Medicine  Office located at: [address] [place], [place]  [address]   Assessment and Plan:   FOR THE DISEASE OF OBESITY:  Class 2 severe obesity due to excess calories with serious comorbidity and body mass index (BMI) of 38.0 to 38.9 in adult (HCC)-starting BMI 03/04/23 38.48 Assessment & Plan: Since last office visit on 07/28/2023 patient's  Muscle mass has increased by 0.8 lb. Fat mass has increased by 3.4 lb. Total body water has increased by 1.2 lb.  Counseling done on how various foods will affect these numbers and how to maximize success  Total lbs lost to date: 19 lbs  Total weight loss percentage to date: 7.51%    Recommended Dietary Goals [name] is currently in the action stage of change. As such, her goal is to continue weight management plan.  Pt frequently travels for work therefore we felt it would be easier for her to journal: she agrees to journal 1400-1500 calories and 90+ grams of protein daily w/ the CAT 2 MP as a guide.   Behavioral Intervention We discussed the following today: increasing lean protein intake to established goals, decreasing simple carbohydrates , staying on track while traveling and vacationing, and focusing on food with a 10:1 ratio of calories: grams of protein  Additional resources provided today: Handout and personalized instruction on tracking and journaling using Apps or handwriting and using logs provided and handout on CAT 2 meal plan  Evidence-based interventions for health behavior change were utilized today including the discussion of self monitoring techniques, problem-solving barriers and SMART goal setting techniques.   Regarding patient's less desirable eating habits and patterns, we employed the technique of small changes.   Pt will specifically work on: making healthier choices when traveling for work.   Recommended Physical Activity Goals [name]  has been advised to work up to 150 minutes of moderate intensity aerobic activity a week and strengthening exercises 2-3 times per week for cardiovascular health, weight loss maintenance and preservation of muscle mass.   She has agreed to : Continue current level of physical activity    Pharmacotherapy We both agreed to : continue with nutritional and behavioral strategies   FOR ASSOCIATED CONDITIONS ADDRESSED TODAY:  Prediabetes Assessment & Plan: Lab Results  Component Value Date   HGBA1C 5.4 07/28/2023   HGBA1C 5.7 (H) 03/04/2023   HGBA1C 5.5 04/02/2020   She is not on any prediabetic medications. Her Hemoglobin A1c has improved from 5.7 to 5.4. Her hunger and cravings are stable. Start journaling intake. Continue working on decreasing simple carbohydrates, increasing lean proteins and exercising to promote weight loss and improve glycemic control.   Vitamin D deficiency Assessment & Plan: Lab Results  Component Value Date   VD25OH 57.5 07/28/2023   VD25OH 32.1 03/04/2023   VD25OH 28.3 (L) 04/02/2020   Pt is doing well on ERGO 50,000 units weekly - her Vitamin D levels are at goal. Continue regimen. Recheck periodically.  Relevant Orders:  -     Vitamin D (Ergocalciferol); Take 1 capsule (50,000 Units total) by mouth every 7 (seven) days.  Dispense: 4 capsule; Refill: 1   Follow up:   Return 11/30/2023. She was informed of the importance of frequent follow up visits to maximize her success with intensive lifestyle modifications for her multiple health conditions.  Subjective:   Chief complaint: Obesity [name] is here to discuss her progress with her obesity treatment plan. She  is on the Category 2 Plan with B/L options and 6 ounces of lean protein at lunch and 8 ounces at dinner and states she is following her eating plan approximately 40% of the time. She states she is doing cardio & weights  30 minutes 3 days per week.  Interval History:  [name] is here for a  follow up office visit. Since last OV on 07/28/2023, Brileigh is up 4 lbs. [name] has been busy traveling for vacation, job interviews, and work. She reports sub-optimal adherence to her meal plan, eating out more, and not getting in the recommended amounts of lean protein. She is ready to get back on track and expresses interest in journaling her intake.   Pharmacotherapy for weight loss: She is currently taking no anti-obesity medication.   Review of Systems:  Pertinent positives were addressed with patient today.  Reviewed by clinician on day of visit: allergies, medications, problem list, medical history, surgical history, family history, social history, and previous encounter notes.  Weight Summary and Biometrics   Weight Lost Since Last Visit: 0  Weight Gained Since Last Visit: 4 lb   Vitals Temp: 98.6 F (37 C) BP: 124/81 Pulse Rate: 68 SpO2: 100 %   Anthropometric Measurements Height: 5\' 8"  (1.727 m) Weight: 234 lb (106.1 kg) BMI (Calculated): 35.59 Weight at Last Visit: 230 lb Weight Lost Since Last Visit: 0 Weight Gained Since Last Visit: 4 lb Starting Weight: 253 lb Total Weight Loss (lbs): 19 lb (8.618 kg)   Body Composition  Body Fat %: 38.9 % Fat Mass (lbs): 91.2 lbs Muscle Mass (lbs): 136.2 lbs Total Body Water (lbs): 88.4 lbs Visceral Fat Rating : 8   Other Clinical Data Fasting: No Labs: No Today's Visit #: 7 Starting Date: 03/04/23    Objective:   PHYSICAL EXAM: Blood pressure 124/81, pulse 68, temperature 98.6 F (37 C), height 5\' 8"  (1.727 m), weight 234 lb (106.1 kg), SpO2 100%. Body mass index is 35.58 kg/m.  General: she is overweight, cooperative and in no acute distress. PSYCH: Has normal mood, affect and thought process.   HEENT: EOMI, sclerae are anicteric. Lungs: Normal breathing effort, no conversational dyspnea. Extremities: Moves * 4 Neurologic: A and O * 3, good insight  DIAGNOSTIC DATA REVIEWED: BMET    Component Value  Date/Time   NA 140 03/04/2023 1203   K 4.6 03/04/2023 1203   CL 102 03/04/2023 1203   CO2 22 03/04/2023 1203   GLUCOSE 73 03/04/2023 1203   BUN 12 03/04/2023 1203   CREATININE 0.93 03/04/2023 1203   CALCIUM 9.7 03/04/2023 1203   GFRNONAA 101 04/02/2020 1409   GFRAA 117 04/02/2020 1409   Lab Results  Component Value Date   HGBA1C 5.4 07/28/2023   HGBA1C 5.5 04/02/2020   Lab Results  Component Value Date   INSULIN 6.1 03/04/2023   INSULIN 16.0 04/02/2020   Lab Results  Component Value Date   TSH 1.050 03/04/2023   CBC    Component Value Date/Time   WBC 4.5 03/04/2023 1203   RBC 5.19 03/04/2023 1203   HGB 13.2 03/04/2023 1203   HCT 40.6 03/04/2023 1203   PLT 278 03/04/2023 1203   MCV 78 (L) 03/04/2023 1203   MCH 25.4 (L) 03/04/2023 1203   MCHC 32.5 03/04/2023 1203   RDW 14.4 03/04/2023 1203   Iron Studies    Component Value Date/Time   IRON 56 05/31/2020 1421   TIBC 415 05/31/2020 1421   FERRITIN 34 05/31/2020 1421  IRONPCTSAT 13 (L) 05/31/2020 1421   Lipid Panel     Component Value Date/Time   CHOL 152 03/04/2023 1203   TRIG 59 03/04/2023 1203   HDL 45 03/04/2023 1203   LDLCALC 95 03/04/2023 1203   Hepatic Function Panel     Component Value Date/Time   PROT 7.4 03/04/2023 1203   ALBUMIN 4.6 03/04/2023 1203   AST 20 03/04/2023 1203   ALT 20 03/04/2023 1203   ALKPHOS 93 03/04/2023 1203   BILITOT 0.9 03/04/2023 1203      Component Value Date/Time   TSH 1.050 03/04/2023 1203   Nutritional Lab Results  Component Value Date   VD25OH 57.5 07/28/2023   VD25OH 32.1 03/04/2023   VD25OH 28.3 (L) 04/02/2020    Attestations:   I, Special Puri, acting as a Stage manager for Thomasene Lot, DO., have compiled all relevant documentation for today's office visit on behalf of Thomasene Lot, DO, while in the presence of [employer], DO.  Reviewed by clinician on day of visit: allergies, medications, problem list, medical history, surgical history,  family history, social history, and previous encounter notes pertinent to patient's obesity diagnosis. I have spent 40 minutes in the care of the patient today including: preparing to see patient (e.g. review and interpretation of tests, old notes ), obtaining and/or reviewing separately obtained history, performing a medically appropriate examination or evaluation, counseling and educating the patient, ordering medications, test or procedures, documenting clinical information in the electronic or other health care record, and independently interpreting results and communicating results to the patient, family, or caregiver   I have reviewed the above documentation for accuracy and completeness, and I agree with the above. Monica York, D.O.  The 21st Century Cures Act was signed into law in 2016 which includes the topic of electronic health records.  This provides immediate access to information in MyChart.  This includes consultation notes, operative notes, office notes, lab results and pathology reports.  If you have any questions about what you read please let us know at your next visit so we can discuss your concerns and take corrective action if need be.  We are right here with you.

## 2023-11-26 ENCOUNTER — Encounter (INDEPENDENT_AMBULATORY_CARE_PROVIDER_SITE_OTHER): Payer: Self-pay | Admitting: Family Medicine

## 2023-11-26 ENCOUNTER — Ambulatory Visit (INDEPENDENT_AMBULATORY_CARE_PROVIDER_SITE_OTHER): Payer: Self-pay | Admitting: Family Medicine

## 2023-11-26 VITALS — BP 118/74 | HR 75 | Temp 98.6°F | Ht 68.0 in | Wt 232.0 lb

## 2023-11-26 DIAGNOSIS — E559 Vitamin D deficiency, unspecified: Secondary | ICD-10-CM

## 2023-11-26 DIAGNOSIS — Z6835 Body mass index (BMI) 35.0-35.9, adult: Secondary | ICD-10-CM

## 2023-11-26 DIAGNOSIS — E66812 Obesity, class 2: Secondary | ICD-10-CM

## 2023-11-26 DIAGNOSIS — R7303 Prediabetes: Secondary | ICD-10-CM

## 2023-11-26 MED ORDER — VITAMIN D (ERGOCALCIFEROL) 1.25 MG (50000 UNIT) PO CAPS
50000.0000 [IU] | ORAL_CAPSULE | ORAL | 1 refills | Status: DC
Start: 2023-11-26 — End: 2024-02-04

## 2023-11-26 NOTE — Progress Notes (Signed)
 Monica York, D.O.  ABFM, ABOM Specializing in Clinical Bariatric Medicine  Office located at: [address] [place], [place]  [address]   Assessment and Plan:   Medications Discontinued During This Encounter  Medication Reason   Vitamin D, Ergocalciferol, (DRISDOL) 1.25 MG (50000 UNIT) CAPS capsule Reorder     Meds ordered this encounter  Medications   Vitamin D, Ergocalciferol, (DRISDOL) 1.25 MG (50000 UNIT) CAPS capsule    Sig: Take 1 capsule (50,000 Units total) by mouth every 7 (seven) days.    Dispense:  4 capsule    Refill:  1    FOR THE DISEASE OF OBESITY:  Class 2 severe obesity due to excess calories with serious comorbidity and body mass index (BMI) of 38.0 to 38.9 in adult (HCC)-starting BMI 03/04/23 38.48 Assessment & Plan: Since last office visit on 10/21/2023 patient's  Muscle mass has not changed (136.2 lbs). Fat mass has decreased by 1.8 lb. Total body water has decreased by 1.2 lb.  Counseling done on how various foods will affect these numbers and how to maximize success  Total lbs lost to date: 21 lbs  Total weight loss percentage to date: 8.30%    Recommended Dietary Goals Monica York is currently in the action stage of change. As such, her goal is to continue weight management plan.  She has agreed to: continue current plan - journaling 1400-1500 calories and 90+ grams of protein daily with the CAT 3 MP as a guide.   Pt was switched from CAT 2 to CAT 3 MP b/c she had trouble getting in 1400-1500 calories when following the former plan.    Behavioral Intervention We discussed the following today: continue to work on maintaining a reduced calorie state, getting the recommended amount of protein, incorporating whole foods, making healthy choices, staying well hydrated and practicing mindfulness when eating.  Additional resources provided today: Handout on CAT 3 meal plan, Handout on Food Journaling Log, and Handout on Common Characteristics of  Successful Weight Losers,  Evidence-based interventions for health behavior change were utilized today including the discussion of self monitoring techniques, problem-solving barriers and SMART goal setting techniques.   Regarding patient's less desirable eating habits and patterns, we employed the technique of small changes.   Pt will: continue food tracking - especially on days she eat's off plan for increased mindfulness.    Recommended Physical Activity Goals Monica York has been advised to work up to  300-450 minutes of moderate intensity aerobic activity a week and strengthening exercises 2-3 times per week for cardiovascular health, weight loss maintenance and preservation of muscle mass.   She has agreed to : continue to gradually increase the amount and intensity of exercise routine   Pharmacotherapy We both agreed to : continue with nutritional and behavioral strategies   FOR ASSOCIATED CONDITIONS ADDRESSED TODAY:  Prediabetes Assessment & Plan: Lab Results  Component Value Date   HGBA1C 5.4 07/28/2023   HGBA1C 5.7 (H) 03/04/2023   HGBA1C 5.5 04/02/2020   INSULIN 6.1 03/04/2023   INSULIN 16.0 04/02/2020    Most recent Hemoglobin A1c and fasting insulin as above. Her hunger and cravings are controlled. Continue with journaling plan and continue to decrease simple carbs/ sugars; increase fiber and proteins.   Vitamin D deficiency Assessment & Plan: Lab Results  Component Value Date   VD25OH 57.5 07/28/2023   VD25OH 32.1 03/04/2023   VD25OH 28.3 (L) 04/02/2020   Most recent Vitamin D as above. Currently on ERGO 50,000 units  every 7 days without any adverse effects such as nausea, vomiting or muscle weakness.  Continue vitamin D supplementation - recheck levels in the future.   Follow up:   Return 12/30/2023 with Monica Rayburn PA. She was informed of the importance of frequent follow up visits to maximize her success with intensive lifestyle modifications for her  multiple health conditions.  Subjective:   Chief complaint: Obesity Monica York is here to discuss her progress with her obesity treatment plan. She is  keeping a food journal and adhering to recommended goals of 1400-1500 calories and 90+ protein with the Category 2 MP as a guide and states she is following her eating plan approximately 75% of the time. She states she is doing cardio and strength training 45 minutes 4 days per week.  Interval History:  Monica York is here for a follow up office visit. Since last OV on 10/21/2023, Monica York is down 2 lbs. She is journaling her intake ~ 5 days a week. She found this activity to be eye-opening and it helped her make better choices. When journaling and using the CAT 2 MP as a guide, she noticed she hit her protein goals but was consistently under in her calories. At the gym, she has been doing more weight training.   Pharmacotherapy for weight loss: n/a  Review of Systems:  Pertinent positives were addressed with patient today.  Reviewed by clinician on day of visit: allergies, medications, problem list, medical history, surgical history, family history, social history, and previous encounter notes.  Weight Summary and Biometrics   Weight Lost Since Last Visit: 2lb  Weight Gained Since Last Visit: 0   Vitals Temp: 98.6 F (37 C) BP: 118/74 Pulse Rate: 75 SpO2: 99 %   Anthropometric Measurements Height: 5\' 8"  (1.727 m) Weight: 232 lb (105.2 kg) BMI (Calculated): 35.28 Weight at Last Visit: 234lb Weight Lost Since Last Visit: 2lb Weight Gained Since Last Visit: 0 Starting Weight: 253lb Total Weight Loss (lbs): 21 lb (9.526 kg)   Body Composition  Body Fat %: 38.4 % Fat Mass (lbs): 89.4 lbs Muscle Mass (lbs): 136.2 lbs Total Body Water (lbs): 87.2 lbs Visceral Fat Rating : 7   Other Clinical Data Fasting: No Labs: No Today's Visit #: 8 Starting Date: 03/04/23   Objective:   PHYSICAL EXAM: Blood pressure 118/74, pulse 75,  temperature 98.6 F (37 C), height 5\' 8"  (1.727 m), weight 232 lb (105.2 kg), SpO2 99%. Body mass index is 35.28 kg/m.  General: she is overweight, cooperative and in no acute distress. PSYCH: Has normal mood, affect and thought process.   HEENT: EOMI, sclerae are anicteric. Lungs: Normal breathing effort, no conversational dyspnea. Extremities: Moves * 4 Neurologic: A and O * 3, good insight  DIAGNOSTIC DATA REVIEWED: BMET    Component Value Date/Time   NA 140 03/04/2023 1203   K 4.6 03/04/2023 1203   CL 102 03/04/2023 1203   CO2 22 03/04/2023 1203   GLUCOSE 73 03/04/2023 1203   BUN 12 03/04/2023 1203   CREATININE 0.93 03/04/2023 1203   CALCIUM 9.7 03/04/2023 1203   GFRNONAA 101 04/02/2020 1409   GFRAA 117 04/02/2020 1409   Lab Results  Component Value Date   HGBA1C 5.4 07/28/2023   HGBA1C 5.5 04/02/2020   Lab Results  Component Value Date   INSULIN 6.1 03/04/2023   INSULIN 16.0 04/02/2020   Lab Results  Component Value Date   TSH 1.050 03/04/2023   CBC    Component Value Date/Time  WBC 4.5 03/04/2023 1203   RBC 5.19 03/04/2023 1203   HGB 13.2 03/04/2023 1203   HCT 40.6 03/04/2023 1203   PLT 278 03/04/2023 1203   MCV 78 (L) 03/04/2023 1203   MCH 25.4 (L) 03/04/2023 1203   MCHC 32.5 03/04/2023 1203   RDW 14.4 03/04/2023 1203   Iron Studies    Component Value Date/Time   IRON 56 05/31/2020 1421   TIBC 415 05/31/2020 1421   FERRITIN 34 05/31/2020 1421   IRONPCTSAT 13 (L) 05/31/2020 1421   Lipid Panel     Component Value Date/Time   CHOL 152 03/04/2023 1203   TRIG 59 03/04/2023 1203   HDL 45 03/04/2023 1203   LDLCALC 95 03/04/2023 1203   Hepatic Function Panel     Component Value Date/Time   PROT 7.4 03/04/2023 1203   ALBUMIN 4.6 03/04/2023 1203   AST 20 03/04/2023 1203   ALT 20 03/04/2023 1203   ALKPHOS 93 03/04/2023 1203   BILITOT 0.9 03/04/2023 1203      Component Value Date/Time   TSH 1.050 03/04/2023 1203   Nutritional Lab  Results  Component Value Date   VD25OH 57.5 07/28/2023   VD25OH 32.1 03/04/2023   VD25OH 28.3 (L) 04/02/2020    Attestations:   I, Special Puri, acting as a Stage manager for Thomasene Lot, DO., have compiled all relevant documentation for today's office visit on behalf of Thomasene Lot, DO, while in the presence of [employer], DO.  I have reviewed the above documentation for accuracy and completeness, and I agree with the above. Monica York, D.O.  The 21st Century Cures Act was signed into law in 2016 which includes the topic of electronic health records.  This provides immediate access to information in MyChart.  This includes consultation notes, operative notes, office notes, lab results and pathology reports.  If you have any questions about what you read please let us know at your next visit so we can discuss your concerns and take corrective action if need be.  We are right here with you.

## 2023-11-30 ENCOUNTER — Ambulatory Visit (INDEPENDENT_AMBULATORY_CARE_PROVIDER_SITE_OTHER): Payer: Self-pay | Admitting: Family Medicine

## 2023-12-30 ENCOUNTER — Ambulatory Visit (INDEPENDENT_AMBULATORY_CARE_PROVIDER_SITE_OTHER): Payer: Self-pay | Admitting: Physician Assistant

## 2024-02-04 ENCOUNTER — Ambulatory Visit (INDEPENDENT_AMBULATORY_CARE_PROVIDER_SITE_OTHER): Payer: Self-pay | Admitting: Family Medicine

## 2024-02-04 ENCOUNTER — Encounter (INDEPENDENT_AMBULATORY_CARE_PROVIDER_SITE_OTHER): Payer: Self-pay | Admitting: Family Medicine

## 2024-02-04 VITALS — BP 116/76 | HR 59 | Temp 98.7°F | Ht 68.0 in | Wt 225.0 lb

## 2024-02-04 DIAGNOSIS — Z6834 Body mass index (BMI) 34.0-34.9, adult: Secondary | ICD-10-CM | POA: Insufficient documentation

## 2024-02-04 DIAGNOSIS — E669 Obesity, unspecified: Secondary | ICD-10-CM | POA: Insufficient documentation

## 2024-02-04 DIAGNOSIS — R7303 Prediabetes: Secondary | ICD-10-CM

## 2024-02-04 DIAGNOSIS — E559 Vitamin D deficiency, unspecified: Secondary | ICD-10-CM

## 2024-02-04 MED ORDER — VITAMIN D (ERGOCALCIFEROL) 1.25 MG (50000 UNIT) PO CAPS
50000.0000 [IU] | ORAL_CAPSULE | ORAL | 0 refills | Status: DC
Start: 2024-02-04 — End: 2024-06-29

## 2024-02-04 NOTE — Progress Notes (Incomplete)
 Monica York, D.O.  ABFM, ABOM Specializing in Clinical Bariatric Medicine  Office located at: [address] [place], [place]  [address]   Assessment and Plan:   Medications Discontinued During This Encounter  Medication Reason   Vitamin D , Ergocalciferol , (DRISDOL ) 1.25 MG (50000 UNIT) CAPS capsule Reorder     Meds ordered this encounter  Medications   Vitamin D , Ergocalciferol , (DRISDOL ) 1.25 MG (50000 UNIT) CAPS capsule    Sig: Take 1 capsule (50,000 Units total) by mouth every 7 (seven) days.    Dispense:  12 capsule    Refill:  0    Recheck Vitamin D  next OV  FOR THE DISEASE OF OBESITY:  Since last office visit on 11/26/2023 patient's  Muscle mass has decreased by 1 lb. Fat mass has decreased by 6 lb. Total body water has decreased by 0.6 lb.  Counseling done on how various foods will affect these numbers and how to maximize success  Total lbs lost to date: 28 lbs  Total weight loss percentage to date: 11.07%    Recommended Dietary Goals Monica York is currently in the action stage of change. As such, her goal is to continue weight management plan.  She has agreed to: continue current plan   Behavioral Intervention We discussed the following today: increasing water intake , continue to work on implementation of reduced calorie nutritional plan, and continue to practice mindfulness when eating, and increasing her protein if she feels hungry after her workouts.   Additional resources provided today: handout on benefits of exercise.   Evidence-based interventions for health behavior change were utilized today including the discussion of self monitoring techniques, problem-solving barriers and SMART goal setting techniques.   Regarding patient's less desirable eating habits and patterns, we employed the technique of small changes.   Pt will specifically work on: n/a   Recommended Physical Activity Goals Monica York has been advised to work up to 300-450 minutes of  moderate intensity aerobic activity a week and strengthening exercises 2-3 times per week for cardiovascular health, weight loss maintenance and preservation of muscle mass.   She has agreed to : continue to gradually increase the amount and intensity of exercise routine   Pharmacotherapy We both agreed to continue behavioral/nutritional strategies.    ASSOCIATED CONDITIONS ADDRESSED TODAY:   Prediabetes Assessment & Plan: Lab Results  Component Value Date   HGBA1C 5.4 07/28/2023   HGBA1C 5.7 (H) 03/04/2023   HGBA1C 5.5 04/02/2020   INSULIN  6.1 03/04/2023   INSULIN  16.0 04/02/2020    Managed with nutritional and behavioral strategies. States her hunger and cravings are well controlled when she gets in the adequate amounts of protein. No acute concerns. Continue adequate hydration, exercise regimen, and journaling plan.     Vitamin D  deficiency Assessment & Plan: Lab Results  Component Value Date   VD25OH 57.5 07/28/2023   VD25OH 32.1 03/04/2023   VD25OH 28.3 (L) 04/02/2020   Pt is on prescription high dose vitamin D  once every 7 days without any complications. Continue vitamin D  supplementation - pt prefers to recheck levels next OV.    Follow up:   Return 05/04/2024 at 7:40 AM. She was informed of the importance of frequent follow up visits to maximize her success with intensive lifestyle modifications for her multiple health conditions.  Subjective:   Chief complaint: Obesity Monica York is here to discuss her progress with her obesity treatment plan. She is journaling 1400-1500 calories and 90+ grams of protein daily with the CAT 3  MP as a guide and states she is following her eating plan approximately 70% of the time. She states she is cardio and or weights at the gym 30-45 minutes 4 days per week.  Interval History:  Monica York is here for a follow up office visit. Since last OV on 11/26/2023, Monica York is down 7 lbs. Her clothes fit looser. Attributes her weight loss  to going to the gym and weight lifting more. When she is going out to eat she is also making better food choices by looking at the menu prior. She journals her intake Monday-Friday.   Pharmacotherapy for weight loss: none   Review of Systems:  Pertinent positives were addressed with patient today.  Reviewed by clinician on day of visit: allergies, medications, problem list, medical history, surgical history, family history, social history, and previous encounter notes.  Weight Summary and Biometrics   Weight Lost Since Last Visit: 7 lb  Weight Gained Since Last Visit: 0   Vitals Temp: 98.7 F (37.1 C) BP: 116/76 Pulse Rate: (!) 59 SpO2: 99 %   Anthropometric Measurements Height: 5' 8 (1.727 m) Weight: 225 lb (102.1 kg) BMI (Calculated): 34.22 Weight at Last Visit: 232 lb Weight Lost Since Last Visit: 7 lb Weight Gained Since Last Visit: 0 Starting Weight: 250 lb Total Weight Loss (lbs): 28 lb (12.7 kg)   Body Composition  Body Fat %: 36.9 % Fat Mass (lbs): 83.4 lbs Muscle Mass (lbs): 135.2 lbs Total Body Water (lbs): 87.8 lbs Visceral Fat Rating : 7   Other Clinical Data Fasting: Yes Labs: No Today's Visit #: 9 Starting Date: 03/28/20    Objective:   PHYSICAL EXAM: Blood pressure 116/76, pulse (!) 59, temperature 98.7 F (37.1 C), height 5' 8 (1.727 m), weight 225 lb (102.1 kg), SpO2 99%. Body mass index is 34.21 kg/m.  General: she is overweight, cooperative and in no acute distress. PSYCH: Has normal mood, affect and thought process.   HEENT: EOMI, sclerae are anicteric. Lungs: Normal breathing effort, no conversational dyspnea. Extremities: Moves * 4 Neurologic: A and O * 3, good insight  DIAGNOSTIC DATA REVIEWED: BMET    Component Value Date/Time   NA 140 03/04/2023 1203   K 4.6 03/04/2023 1203   CL 102 03/04/2023 1203   CO2 22 03/04/2023 1203   GLUCOSE 73 03/04/2023 1203   BUN 12 03/04/2023 1203   CREATININE 0.93 03/04/2023 1203    CALCIUM 9.7 03/04/2023 1203   GFRNONAA 101 04/02/2020 1409   GFRAA 117 04/02/2020 1409   Lab Results  Component Value Date   HGBA1C 5.4 07/28/2023   HGBA1C 5.5 04/02/2020   Lab Results  Component Value Date   INSULIN  6.1 03/04/2023   INSULIN  16.0 04/02/2020   Lab Results  Component Value Date   TSH 1.050 03/04/2023   CBC    Component Value Date/Time   WBC 4.5 03/04/2023 1203   RBC 5.19 03/04/2023 1203   HGB 13.2 03/04/2023 1203   HCT 40.6 03/04/2023 1203   PLT 278 03/04/2023 1203   MCV 78 (L) 03/04/2023 1203   MCH 25.4 (L) 03/04/2023 1203   MCHC 32.5 03/04/2023 1203   RDW 14.4 03/04/2023 1203   Iron Studies    Component Value Date/Time   IRON 56 05/31/2020 1421   TIBC 415 05/31/2020 1421   FERRITIN 34 05/31/2020 1421   IRONPCTSAT 13 (L) 05/31/2020 1421   Lipid Panel     Component Value Date/Time   CHOL 152 03/04/2023 1203  TRIG 59 03/04/2023 1203   HDL 45 03/04/2023 1203   LDLCALC 95 03/04/2023 1203   Hepatic Function Panel     Component Value Date/Time   PROT 7.4 03/04/2023 1203   ALBUMIN 4.6 03/04/2023 1203   AST 20 03/04/2023 1203   ALT 20 03/04/2023 1203   ALKPHOS 93 03/04/2023 1203   BILITOT 0.9 03/04/2023 1203      Component Value Date/Time   TSH 1.050 03/04/2023 1203   Nutritional Lab Results  Component Value Date   VD25OH 57.5 07/28/2023   VD25OH 32.1 03/04/2023   VD25OH 28.3 (L) 04/02/2020    Attestations:   I, Special Puri, acting as a Stage manager for Marceil Sensor, DO., have compiled all relevant documentation for today's office visit on behalf of Marceil Sensor, DO, while in the presence of [employer], DO.  I have reviewed the above documentation for accuracy and completeness, and I agree with the above. Monica York, D.O.  The 21st Century Cures Act was signed into law in 2016 which includes the topic of electronic health records.  This provides immediate access to information in MyChart.  This includes  consultation notes, operative notes, office notes, lab results and pathology reports.  If you have any questions about what you read please let us  know at your next visit so we can discuss your concerns and take corrective action if need be.  We are right here with you.

## 2024-02-18 ENCOUNTER — Other Ambulatory Visit (INDEPENDENT_AMBULATORY_CARE_PROVIDER_SITE_OTHER): Payer: Self-pay | Admitting: Family Medicine

## 2024-02-18 DIAGNOSIS — E559 Vitamin D deficiency, unspecified: Secondary | ICD-10-CM

## 2024-04-30 ENCOUNTER — Other Ambulatory Visit (INDEPENDENT_AMBULATORY_CARE_PROVIDER_SITE_OTHER): Payer: Self-pay | Admitting: Family Medicine

## 2024-04-30 DIAGNOSIS — E559 Vitamin D deficiency, unspecified: Secondary | ICD-10-CM

## 2024-05-04 ENCOUNTER — Ambulatory Visit (INDEPENDENT_AMBULATORY_CARE_PROVIDER_SITE_OTHER): Payer: Self-pay | Admitting: Family Medicine

## 2024-06-01 ENCOUNTER — Encounter (INDEPENDENT_AMBULATORY_CARE_PROVIDER_SITE_OTHER): Payer: Self-pay

## 2024-06-07 ENCOUNTER — Ambulatory Visit (INDEPENDENT_AMBULATORY_CARE_PROVIDER_SITE_OTHER): Payer: Self-pay | Admitting: Family Medicine

## 2024-06-08 ENCOUNTER — Ambulatory Visit (INDEPENDENT_AMBULATORY_CARE_PROVIDER_SITE_OTHER): Payer: Self-pay | Admitting: Family Medicine

## 2024-06-16 ENCOUNTER — Ambulatory Visit (INDEPENDENT_AMBULATORY_CARE_PROVIDER_SITE_OTHER): Payer: Self-pay | Admitting: Family Medicine

## 2024-06-29 ENCOUNTER — Ambulatory Visit (INDEPENDENT_AMBULATORY_CARE_PROVIDER_SITE_OTHER): Payer: Self-pay | Admitting: Family Medicine

## 2024-06-29 ENCOUNTER — Encounter (INDEPENDENT_AMBULATORY_CARE_PROVIDER_SITE_OTHER): Payer: Self-pay

## 2024-06-29 ENCOUNTER — Other Ambulatory Visit (INDEPENDENT_AMBULATORY_CARE_PROVIDER_SITE_OTHER): Payer: Self-pay

## 2024-06-29 ENCOUNTER — Encounter (INDEPENDENT_AMBULATORY_CARE_PROVIDER_SITE_OTHER): Payer: Self-pay | Admitting: Family Medicine

## 2024-06-29 VITALS — BP 118/79 | HR 66 | Temp 97.7°F | Ht 68.0 in | Wt 219.0 lb

## 2024-06-29 DIAGNOSIS — E559 Vitamin D deficiency, unspecified: Secondary | ICD-10-CM

## 2024-06-29 DIAGNOSIS — Z6833 Body mass index (BMI) 33.0-33.9, adult: Secondary | ICD-10-CM

## 2024-06-29 DIAGNOSIS — Z6838 Body mass index (BMI) 38.0-38.9, adult: Secondary | ICD-10-CM

## 2024-06-29 DIAGNOSIS — G479 Sleep disorder, unspecified: Secondary | ICD-10-CM | POA: Insufficient documentation

## 2024-06-29 DIAGNOSIS — E669 Obesity, unspecified: Secondary | ICD-10-CM

## 2024-06-29 DIAGNOSIS — R7303 Prediabetes: Secondary | ICD-10-CM

## 2024-06-29 MED ORDER — VITAMIN D (ERGOCALCIFEROL) 1.25 MG (50000 UNIT) PO CAPS: 50000.0000 [IU] | ORAL_CAPSULE | ORAL | 0 refills | Status: AC

## 2024-06-29 MED ORDER — VITAMIN D (ERGOCALCIFEROL) 1.25 MG (50000 UNIT) PO CAPS: 50000.0000 [IU] | ORAL_CAPSULE | ORAL | 0 refills | Status: DC

## 2024-06-29 NOTE — Progress Notes (Signed)
 Monica DOROTHA York, D.O.  ABFM, ABOM Specializing in Clinical Bariatric Medicine  Office located at: [address] [place], [place]  [address]    FOR THE CHRONIC DISEASE OF OBESITY:   Obesity, Beginning BMI 38.01 BMI 38.0-38.48,adult - current BMI 33.31  Weight Summary and Body Composition Analysis  Weight Lost Since Last Visit: 6lb  Weight Gained Since Last Visit: 0lb    Vitals Temp: 97.7 F (36.5 C) BP: 118/79 Pulse Rate: 66 SpO2: 98 %   Anthropometric Measurements Height: 5' 8 (1.727 m) Weight: 219 lb (99.3 kg) BMI (Calculated): 33.31 Weight at Last Visit: 225lb Weight Lost Since Last Visit: 6lb Weight Gained Since Last Visit: 0lb Starting Weight: 250lb Total Weight Loss (lbs): 34 lb (15.4 kg)   Body Composition  Body Fat %: 37 % Fat Mass (lbs): 81.2 lbs Muscle Mass (lbs): 131.4 lbs Total Body Water (lbs): 84.2 lbs Visceral Fat Rating : 7   Other Clinical Data Fasting: yes Labs: no Today's Visit #: 10 Starting Date: 04/02/20    Chief complaint: Obesity  Interval History Monica York is here for a follow-up office visit to discuss her progress with her obesity treatment plan. She is keeping a food journal and adhering to recommended goals of 1400-1500 calories and 90+ grams protein and states she is following her eating plan approximately 75 % of the time. She is doing cardio and strength training 45  minutes 5 days per week  She has experienced a weight loss of 6 lbs since last OV on 02/04/2024.   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  Her dietary and life habits include:  - Tracking Calories/Macros: yes  - Eating More Whole Foods: yes  - Adequate Protein Intake:  consumes 100 grams protein daily  - Adequate Water Intake: yes  - Skipping Meals: no  - Sleeping 7-9 Hours/ Night: no    02/04/24 11:00 06/29/24 09:00   Body Fat % 36.9 % 37 %  Muscle Mass (lbs) 135.2 lbs 131.4 lbs  Fat Mass (lbs) 83.4 lbs 81.2  lbs  Total Body Water (lbs) 87.8 lbs 84.2 lbs  Visceral Fat Rating  7 7   Counseling done on how various foods will affect these numbers and how to maximize success  Total Fat mass lost to date:  - 25.2 lbs Total lbs lost to date: - 31 lbs Total weight loss percentage to date: - 12.40 %   Nutritional and Behavioral Counseling:  We discussed the following today: blue light/investing in blue light blocking glasses, egg whites, high protein cereals,  increasing lean protein intake to established goals, increasing fiber rich foods, and increasing water intake .  Additional resources provided today: Handout on CAT 3 meal plan, Handout on CAT 3-4 breakfast options, and Handout on CAT 3-4 lunch options  Evidence-based interventions for health behavior change were utilized today including the discussion of self monitoring techniques, problem-solving barriers and SMART goal setting techniques.   Regarding patient's less desirable eating habits and patterns, we employed the technique of small changes.   SMART Goal(s) created today: increase proteins through foods like egg whites, high protein cereals, etc and increase water to 110 + ounces daily.   Recommended Dietary Goals Monica York is currently in the action stage of change. As such, her goal is to continue weight management plan.  She has agreed to continue journaling 1400-1500 calories and 90++ grams protein    Recommended Physical Activity Goals Monica York has been advised to  work up to 300-450 minutes of moderate intensity aerobic activity a week and strengthening exercises 2-3 times per week for cardiovascular health, weight loss maintenance and preservation of muscle mass.   She was encouraged to Continue to gradually increase the amount and intensity of exercise routine   Medical Interventions and Pharmacotherapy Previous Bariatric surgery: n/a Pharmacotherapy: Continue with current nutritional and behavioral strategies   OBESITY RELATED  CONDITIONS ADDRESSED TODAY:   Orders Placed This Encounter  Procedures   Basic metabolic panel with GFR   VITAMIN D  25 Hydroxy (Vit-D Deficiency, Fractures)   Hemoglobin A1c   Insulin , random    Medications Discontinued During This Encounter  Medication Reason   Vitamin D , Ergocalciferol , (DRISDOL ) 1.25 MG (50000 UNIT) CAPS capsule Reorder     Meds ordered this encounter  Medications   DISCONTD: Vitamin D , Ergocalciferol , (DRISDOL ) 1.25 MG (50000 UNIT) CAPS capsule    Sig: Take 1 capsule (50,000 Units total) by mouth every 7 (seven) days.    Dispense:  12 capsule    Refill:  0     Prediabetes Assessment & Plan: Lab Results  Component Value Date   HGBA1C 5.4 07/28/2023   HGBA1C 5.7 (H) 03/04/2023   HGBA1C 5.5 04/02/2020   INSULIN  6.1 03/04/2023   INSULIN  16.0 04/02/2020    Pre-DM managed with dietary and lifestyle interventions. No complaints of excessive hunger or cravings. No acute concerns. Continue working on nutrition plan to decrease simple carbohydrates, increase lean proteins and exercise to promote weight loss and improve glycemic control and prevent progression to T2DM. Recheck labs today.    Vitamin D  deficiency Assessment & Plan:  Lab Results  Component Value Date   VD25OH 57.5 07/28/2023   VD25OH 32.1 03/04/2023   VD25OH 28.3 (L) 04/02/2020   Pt is doing well on Ergocalciferol  50,000 units weekly. No acute concerns. Cont wt loss efforts and supplementation.  Recheck as deemed medically necessary. Recheck labs today.    Sleep difficulties Assessment & Plan: She reports not sleeping 7-9 hours per night. She has been busy with work and full-time studies and often stays up late using the computer. Reviewed sleep-hygiene practices and recommended trying blue-light-blocking glasses. Encouraged her to aim for 7-9 hours of sleep nightly to support weight-management goals and overall health.    Objective:   PHYSICAL EXAM: Blood pressure 118/79, pulse 66,  temperature 97.7 F (36.5 C), height 5' 8 (1.727 m), weight 219 lb (99.3 kg), SpO2 98%. Body mass index is 33.3 kg/m.  General: she is overweight, cooperative and in no acute distress. PSYCH: Has normal mood, affect and thought process.   HEENT: EOMI, sclerae are anicteric. Lungs: Normal breathing effort, no conversational dyspnea. Extremities: Moves * 4 Neurologic: A and O * 3, good insight  DIAGNOSTIC DATA REVIEWED: BMET    Component Value Date/Time   NA 140 03/04/2023 1203   K 4.6 03/04/2023 1203   CL 102 03/04/2023 1203   CO2 22 03/04/2023 1203   GLUCOSE 73 03/04/2023 1203   BUN 12 03/04/2023 1203   CREATININE 0.93 03/04/2023 1203   CALCIUM 9.7 03/04/2023 1203   GFRNONAA 101 04/02/2020 1409   GFRAA 117 04/02/2020 1409   Lab Results  Component Value Date   HGBA1C 5.4 07/28/2023   HGBA1C 5.5 04/02/2020   Lab Results  Component Value Date   INSULIN  6.1 03/04/2023   INSULIN  16.0 04/02/2020   Lab Results  Component Value Date   TSH 1.050 03/04/2023   CBC    Component Value  Date/Time   WBC 4.5 03/04/2023 1203   RBC 5.19 03/04/2023 1203   HGB 13.2 03/04/2023 1203   HCT 40.6 03/04/2023 1203   PLT 278 03/04/2023 1203   MCV 78 (L) 03/04/2023 1203   MCH 25.4 (L) 03/04/2023 1203   MCHC 32.5 03/04/2023 1203   RDW 14.4 03/04/2023 1203   Iron Studies    Component Value Date/Time   IRON 56 05/31/2020 1421   TIBC 415 05/31/2020 1421   FERRITIN 34 05/31/2020 1421   IRONPCTSAT 13 (L) 05/31/2020 1421   Lipid Panel     Component Value Date/Time   CHOL 152 03/04/2023 1203   TRIG 59 03/04/2023 1203   HDL 45 03/04/2023 1203   LDLCALC 95 03/04/2023 1203   Hepatic Function Panel     Component Value Date/Time   PROT 7.4 03/04/2023 1203   ALBUMIN 4.6 03/04/2023 1203   AST 20 03/04/2023 1203   ALT 20 03/04/2023 1203   ALKPHOS 93 03/04/2023 1203   BILITOT 0.9 03/04/2023 1203      Component Value Date/Time   TSH 1.050 03/04/2023 1203   Nutritional Lab  Results  Component Value Date   VD25OH 57.5 07/28/2023   VD25OH 32.1 03/04/2023   VD25OH 28.3 (L) 04/02/2020     Follow up:   Return 08/03/2024 at 9:00 AM.  She was informed of the importance of frequent follow up visits to maximize her success with intensive lifestyle modifications for her multiple health conditions.  Monica York is aware that we will review all of her lab results at our next visit together in person.  She is aware that if anything is critical/ life threatening with the results, we will be contacting her via MyChart or by my CMA will be calling them prior to the office visit to discuss acute management.    Attestations:   I, Special Puri, acting as a stage manager for [employer], DO., have compiled all relevant documentation for today's office visit on behalf of Monica Jenkins, DO, while in the presence of [employer], DO.  Pertinent positives were addressed with patient today. Reviewed by clinician on day of visit: allergies, medications, problem list, medical history, surgical history, family history, social history, and previous encounter notes.  I have reviewed the above documentation for accuracy and completeness, and I agree with the above. Monica York, D.O.  The 21st Century Cures Act was signed into law in 2016 which includes the topic of electronic health records.  This provides immediate access to information in MyChart. This includes consultation notes, operative notes, office notes, lab results and pathology reports.  If you have any questions about what you read please let us  know at your next visit so we can discuss your concerns and take corrective action if need be.  We are right here with you.

## 2024-06-30 LAB — BASIC METABOLIC PANEL WITH GFR
BUN/Creatinine Ratio: 17 (ref 9–23)
BUN: 17 mg/dL (ref 6–20)
CO2: 22 mmol/L (ref 20–29)
Calcium: 10.1 mg/dL (ref 8.7–10.2)
Chloride: 104 mmol/L (ref 96–106)
Creatinine, Ser: 1 mg/dL (ref 0.57–1.00)
Glucose: 80 mg/dL (ref 70–99)
Potassium: 4.6 mmol/L (ref 3.5–5.2)
Sodium: 141 mmol/L (ref 134–144)
eGFR: 78 mL/min/1.73 (ref >59)

## 2024-06-30 LAB — HEMOGLOBIN A1C
Est. average glucose Bld gHb Est-mCnc: 105 mg/dL
Hgb A1c MFr Bld: 5.3 % (ref 4.8–5.6)

## 2024-06-30 LAB — VITAMIN D 25 HYDROXY (VIT D DEFICIENCY, FRACTURES): Vit D, 25-Hydroxy: 46.9 ng/mL (ref 30.0–100.0)

## 2024-06-30 LAB — INSULIN, RANDOM: INSULIN: 7.2 u[IU]/mL (ref 2.6–24.9)

## 2024-08-03 ENCOUNTER — Ambulatory Visit (INDEPENDENT_AMBULATORY_CARE_PROVIDER_SITE_OTHER): Payer: Self-pay | Admitting: Family Medicine

## 2024-08-12 ENCOUNTER — Other Ambulatory Visit: Payer: Self-pay | Admitting: Nurse Practitioner

## 2024-08-12 DIAGNOSIS — N644 Mastodynia: Secondary | ICD-10-CM

## 2024-09-13 ENCOUNTER — Other Ambulatory Visit: Payer: Self-pay

## 2024-09-18 ENCOUNTER — Other Ambulatory Visit (INDEPENDENT_AMBULATORY_CARE_PROVIDER_SITE_OTHER): Payer: Self-pay | Admitting: Family Medicine

## 2024-09-18 DIAGNOSIS — E559 Vitamin D deficiency, unspecified: Secondary | ICD-10-CM
